# Patient Record
Sex: Male | Born: 1960 | Race: White | Hispanic: No | Marital: Married | Smoking: Never smoker
Health system: Southern US, Community
[De-identification: ages and names within clinical notes are randomized; demographics above are authoritative.]

## PROBLEM LIST (undated history)

## (undated) DIAGNOSIS — G4730 Sleep apnea, unspecified: Secondary | ICD-10-CM

## (undated) DIAGNOSIS — K219 Gastro-esophageal reflux disease without esophagitis: Secondary | ICD-10-CM

## (undated) DIAGNOSIS — E039 Hypothyroidism, unspecified: Secondary | ICD-10-CM

## (undated) DIAGNOSIS — J45909 Unspecified asthma, uncomplicated: Secondary | ICD-10-CM

## (undated) DIAGNOSIS — Z8679 Personal history of other diseases of the circulatory system: Secondary | ICD-10-CM

## (undated) DIAGNOSIS — G629 Polyneuropathy, unspecified: Secondary | ICD-10-CM

## (undated) DIAGNOSIS — G43909 Migraine, unspecified, not intractable, without status migrainosus: Secondary | ICD-10-CM

## (undated) DIAGNOSIS — Z8669 Personal history of other diseases of the nervous system and sense organs: Secondary | ICD-10-CM

## (undated) DIAGNOSIS — Z0000 Encounter for general adult medical examination without abnormal findings: Secondary | ICD-10-CM

## (undated) DIAGNOSIS — N486 Induration penis plastica: Secondary | ICD-10-CM

## (undated) DIAGNOSIS — K625 Hemorrhage of anus and rectum: Secondary | ICD-10-CM

## (undated) HISTORY — DX: Hypothyroidism, unspecified: E03.9

## (undated) HISTORY — DX: Polyneuropathy, unspecified: G62.9

## (undated) HISTORY — PX: THYROIDECTOMY: SHX17

## (undated) HISTORY — DX: Migraine, unspecified, not intractable, without status migrainosus: G43.909

## (undated) HISTORY — PX: INNER EAR SURGERY: SHX679

## (undated) HISTORY — PX: HERNIA REPAIR: SHX51

## (undated) HISTORY — PX: TONSILLECTOMY AND ADENOIDECTOMY: SHX28

## (undated) HISTORY — DX: Unspecified asthma, uncomplicated: J45.909

## (undated) HISTORY — DX: Encounter for general adult medical examination without abnormal findings: Z00.00

## (undated) HISTORY — DX: Induration penis plastica: N48.6

## (undated) HISTORY — PX: OTHER SURGICAL HISTORY: SHX169

## (undated) HISTORY — DX: Hemorrhage of anus and rectum: K62.5

## (undated) HISTORY — DX: Personal history of other diseases of the nervous system and sense organs: Z86.69

## (undated) HISTORY — PX: VASECTOMY: SHX75

---

## 2004-12-26 ENCOUNTER — Ambulatory Visit: Payer: Self-pay | Admitting: Orthopedic Surgery

## 2008-06-12 ENCOUNTER — Observation Stay: Payer: Self-pay | Admitting: Internal Medicine

## 2008-06-16 ENCOUNTER — Ambulatory Visit: Payer: Self-pay | Admitting: Family Medicine

## 2009-04-28 ENCOUNTER — Emergency Department: Payer: Self-pay | Admitting: Emergency Medicine

## 2009-09-18 ENCOUNTER — Ambulatory Visit: Payer: Self-pay | Admitting: Surgery

## 2009-09-21 ENCOUNTER — Ambulatory Visit: Payer: Self-pay | Admitting: Surgery

## 2011-06-17 IMAGING — CR DG CHEST 2V
1 series · 2 of 2 positions shown · non-contrast
Comparison: none

REASON FOR EXAM: chest pain..pt in rm 15
COMMENTS:   LMP: (Male)

[Series 1: view not recorded · 0.17mm/px · 2 of 2 slices shown]
[im 1/2]
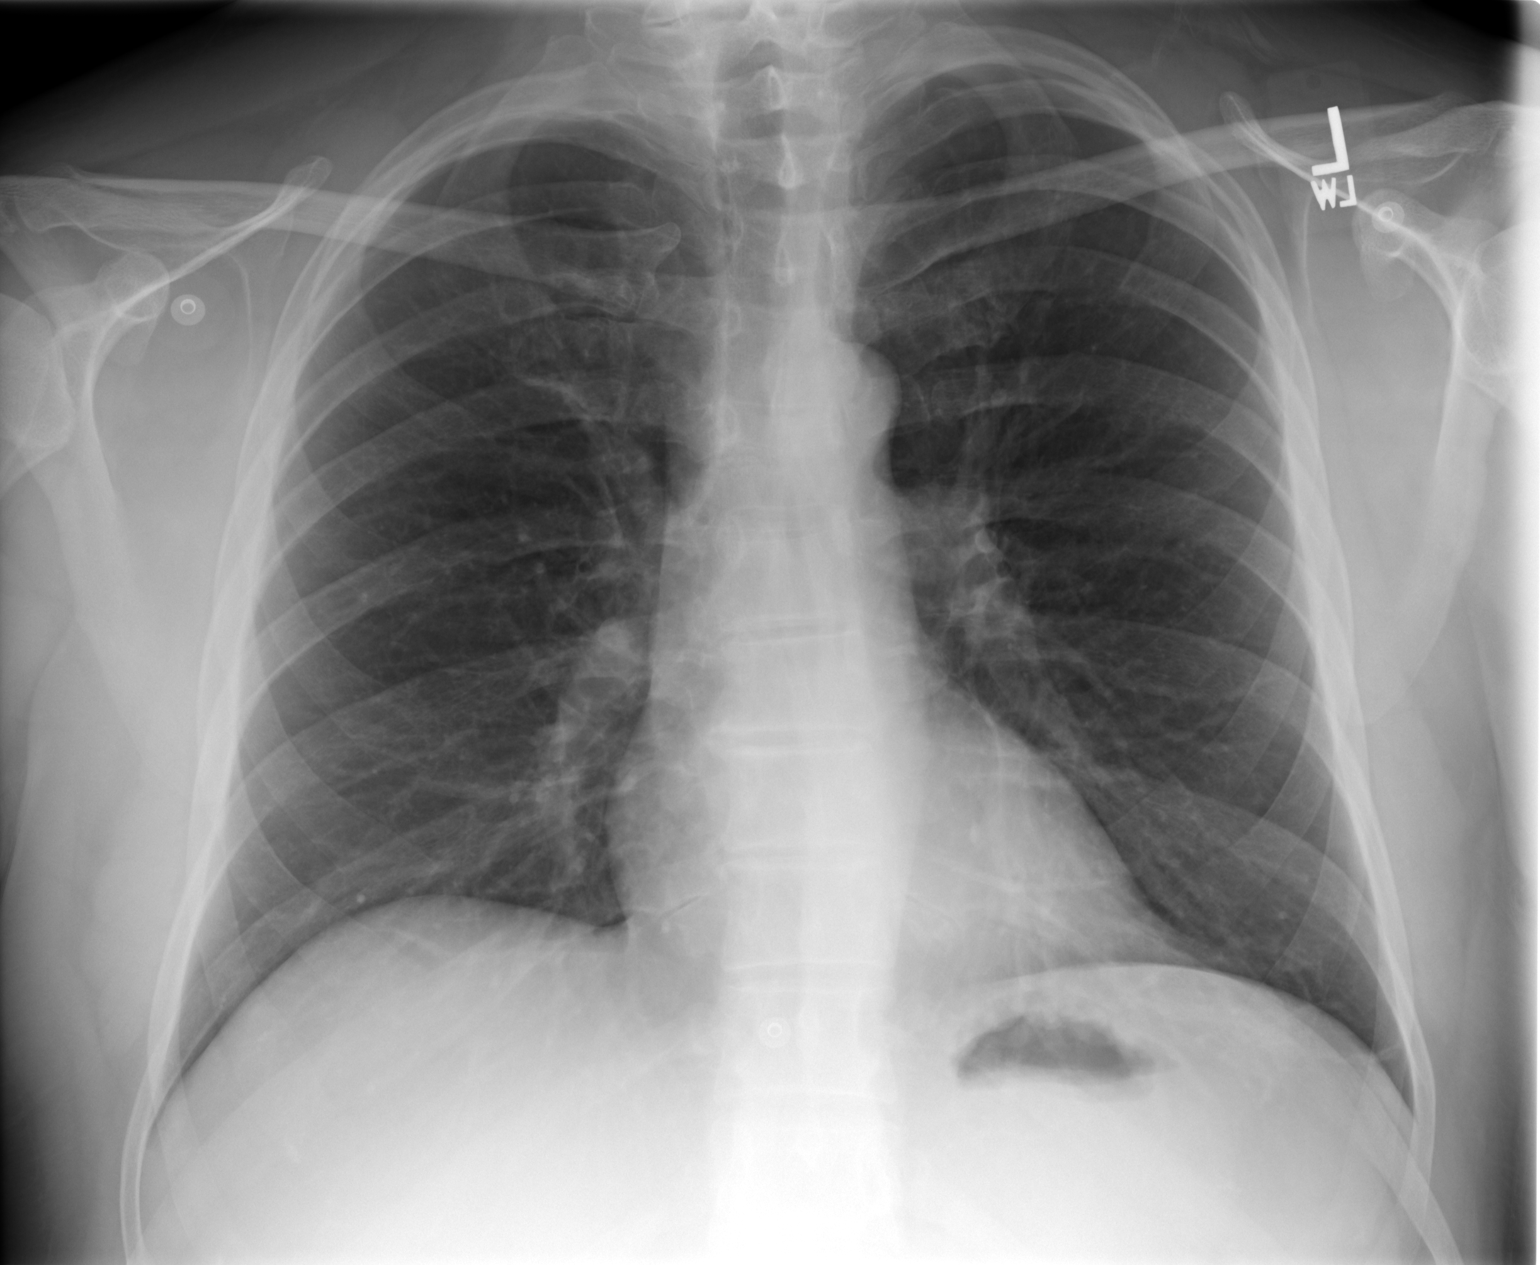
[im 2/2]
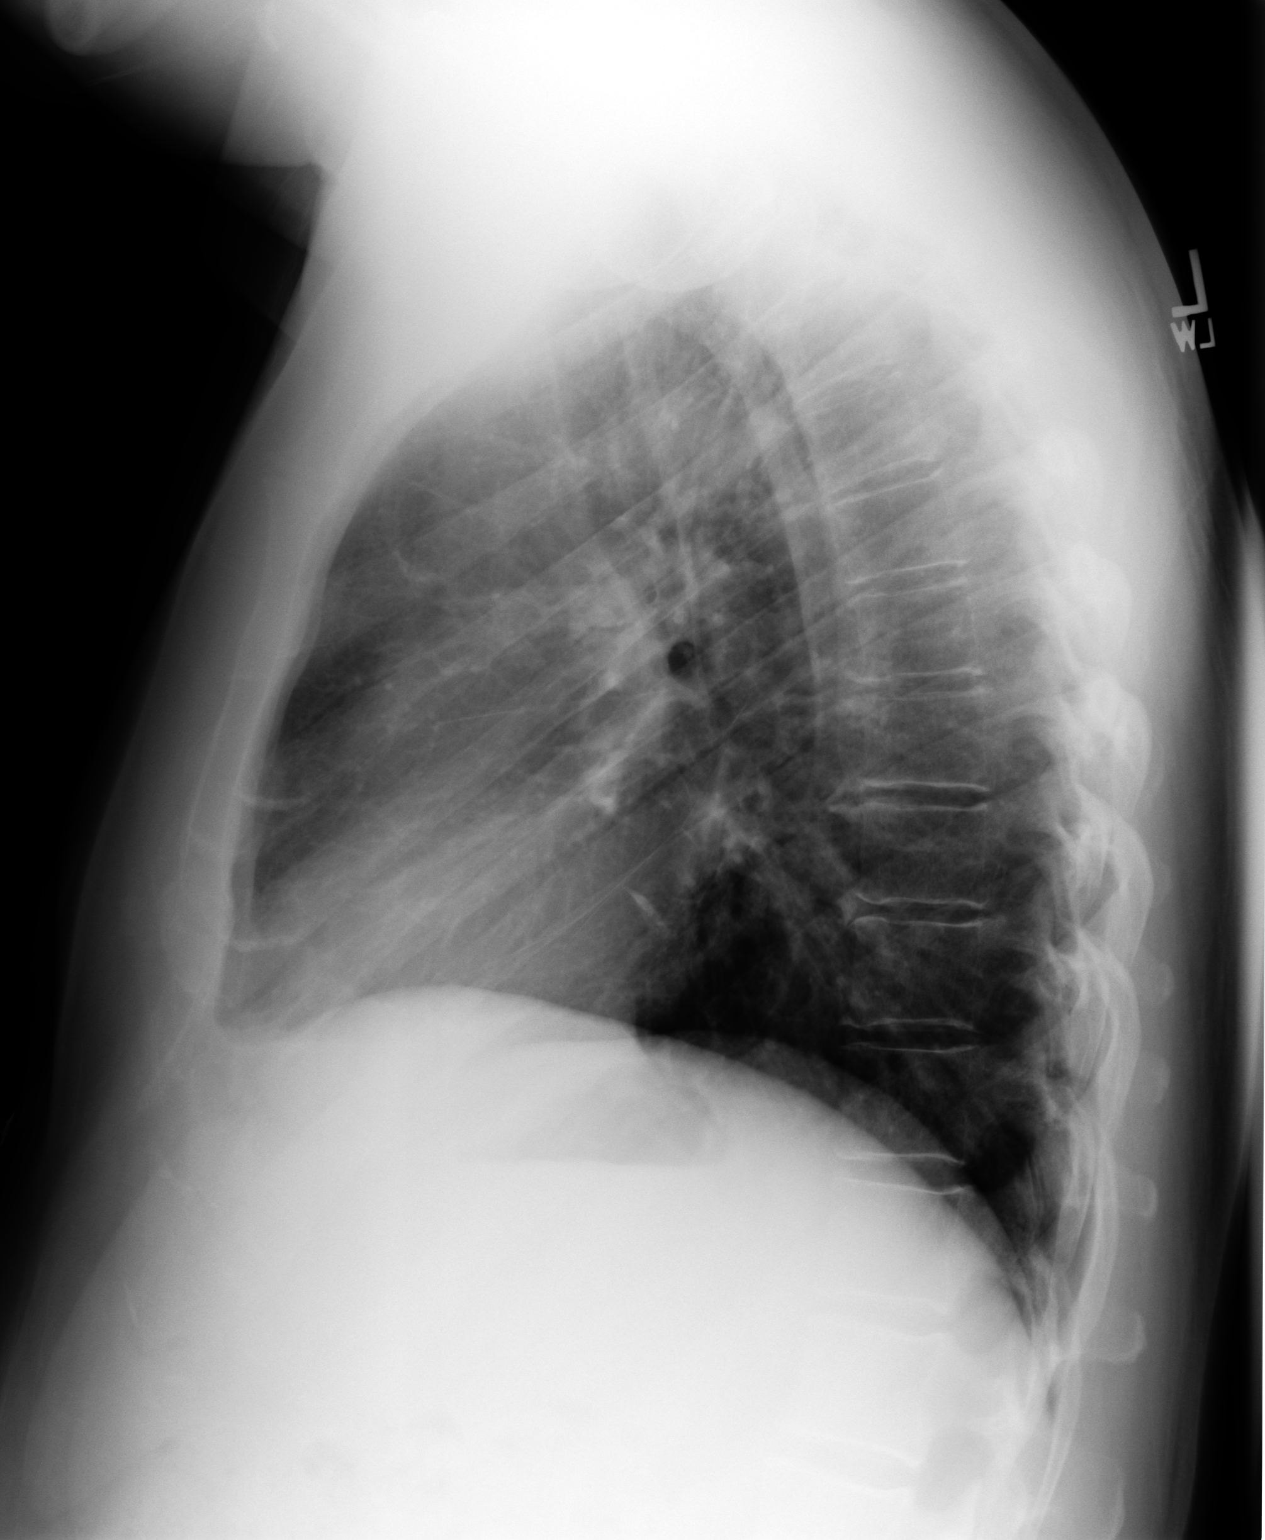

[2 of 2 positions shown; findings below may reference images not displayed]

PROCEDURE:     DXR - DXR CHEST PA (OR AP) AND LATERAL  - April 28, 2009  [DATE]

RESULT:     Comparison is made to the study 12 June, 2008.

The lungs are well-expanded. There is no focal infiltrate. The heart is
normal in size. The pulmonary vascularity is not engorged. There is mild
tortuosity of the descending thoracic aorta. The bony thorax is grossly
intact.
IMPRESSION: I do not see evidence of acute cardiopulmonary abnormality.

## 2012-01-09 ENCOUNTER — Emergency Department: Payer: Self-pay | Admitting: *Deleted

## 2012-01-09 LAB — BASIC METABOLIC PANEL
Anion Gap: 8 (ref 7–16)
BUN: 13 mg/dL (ref 7–18)
Creatinine: 0.93 mg/dL (ref 0.60–1.30)
EGFR (African American): >60
EGFR (Non-African Amer.): >60
Glucose: 139 mg/dL — ABNORMAL HIGH (ref 65–99)
Osmolality: 284 (ref 275–301)

## 2012-01-09 LAB — CK TOTAL AND CKMB (NOT AT ARMC)
CK, Total: 155 U/L (ref 35–232)
CK-MB: 2.6 ng/mL (ref 0.5–3.6)

## 2012-01-09 LAB — CBC
HCT: 40.4 % (ref 40.0–52.0)
HGB: 14.2 g/dL (ref 13.0–18.0)
MCH: 30.7 pg (ref 26.0–34.0)
MCHC: 35.1 g/dL (ref 32.0–36.0)
MCV: 88 fL (ref 80–100)

## 2012-07-30 ENCOUNTER — Ambulatory Visit: Payer: Self-pay | Admitting: Family Medicine

## 2012-07-30 LAB — RAPID INFLUENZA A&B ANTIGENS

## 2013-05-04 ENCOUNTER — Ambulatory Visit: Payer: Self-pay | Admitting: Family Medicine

## 2013-05-15 DIAGNOSIS — D1739 Benign lipomatous neoplasm of skin and subcutaneous tissue of other sites: Secondary | ICD-10-CM | POA: Insufficient documentation

## 2014-01-10 ENCOUNTER — Ambulatory Visit: Payer: Self-pay | Admitting: Family Medicine

## 2014-02-27 IMAGING — CT CT HEAD WITHOUT CONTRAST
2 series · 16 of 30 positions shown, 20 images · non-contrast
Comparison: none

REASON FOR EXAM: headache; states worst headache in his life
COMMENTS:

PROCEDURE:     CT  - CT HEAD WITHOUT CONTRAST  - January 09, 2012 [DATE]
RESULT:     Comparison:  06/12/2008
TECHNIQUE: Multiple axial images from the foramen magnum to the vertex were
obtained without IV contrast.

[Series 2: without · axial · non-contrast · 0.42mm/px · z∈[+1256,+1390]mm · 13 of 33 slices shown, 17 images]
[im 3/33  brain]
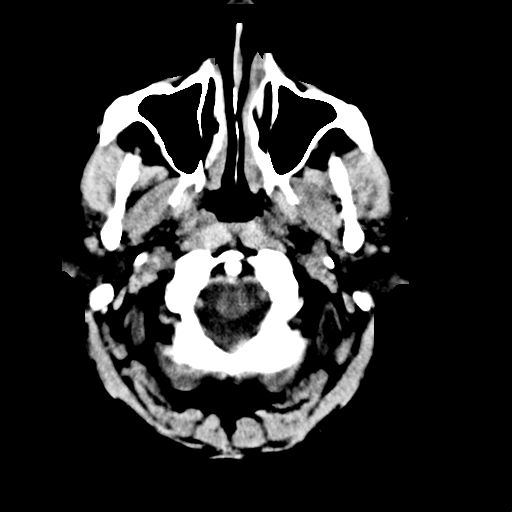
[im 3/33  bone]
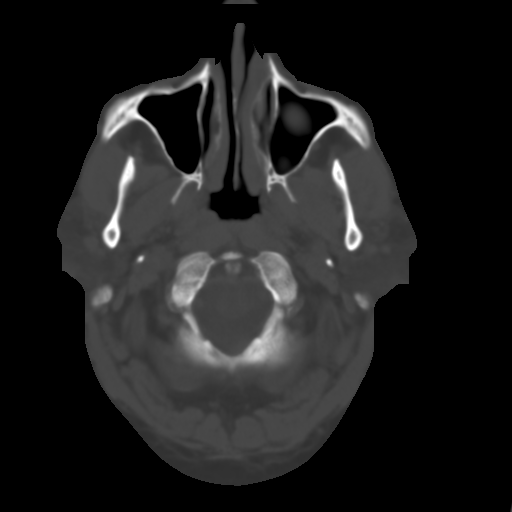
[im 5/33  brain]
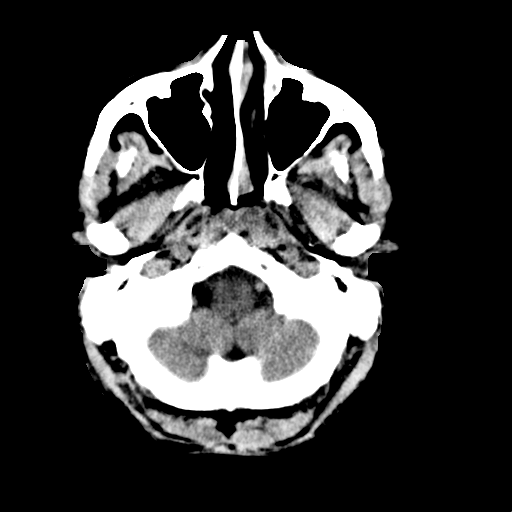
[im 7/33  brain]
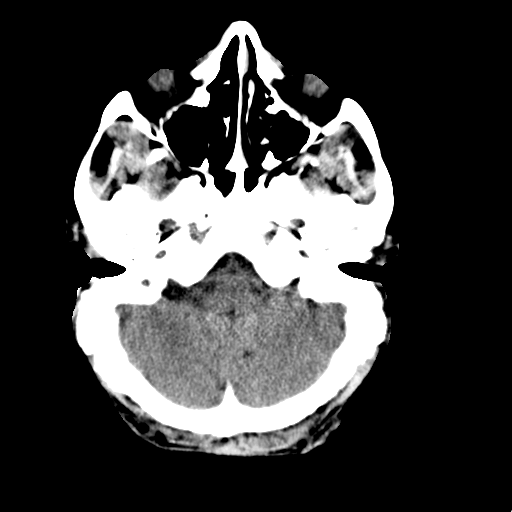
[im 10/33  brain]
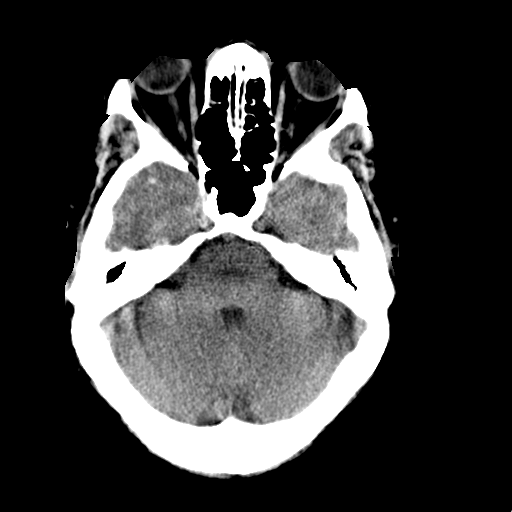
[im 12/33  brain]
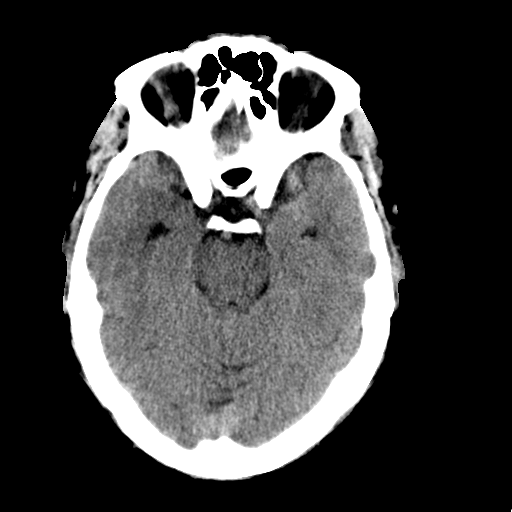
[im 12/33  bone]
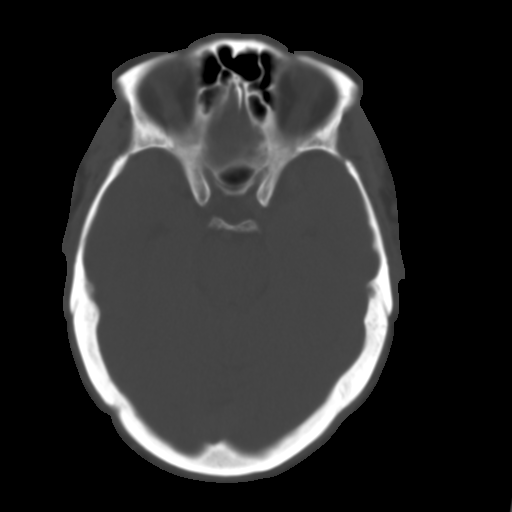
[im 14/33  brain]
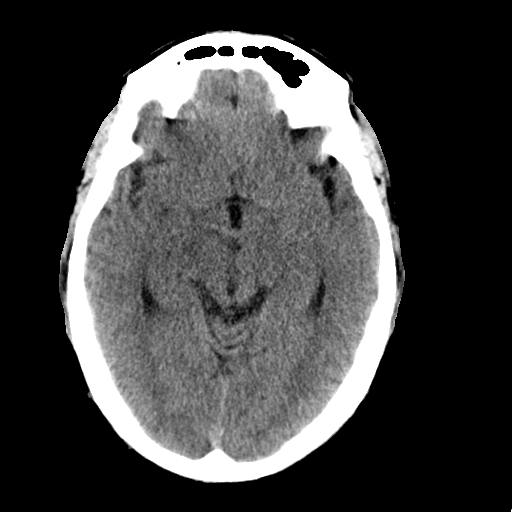
[im 17/33  brain]
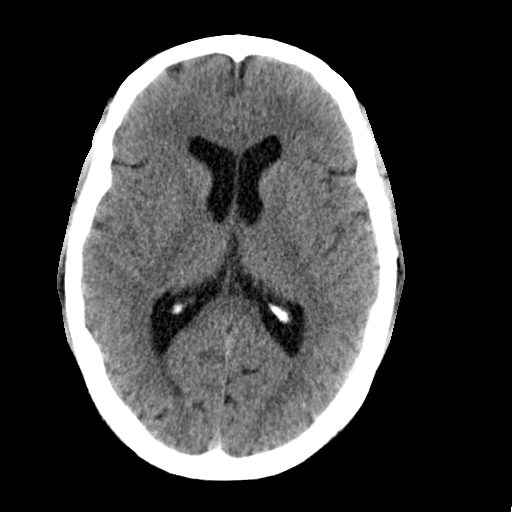
[im 19/33  brain]
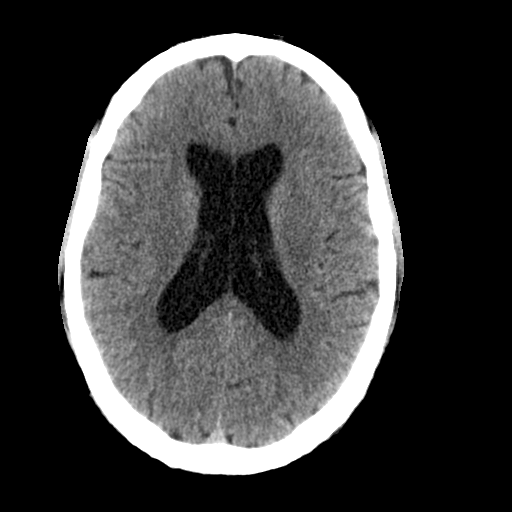
[im 21/33  brain]
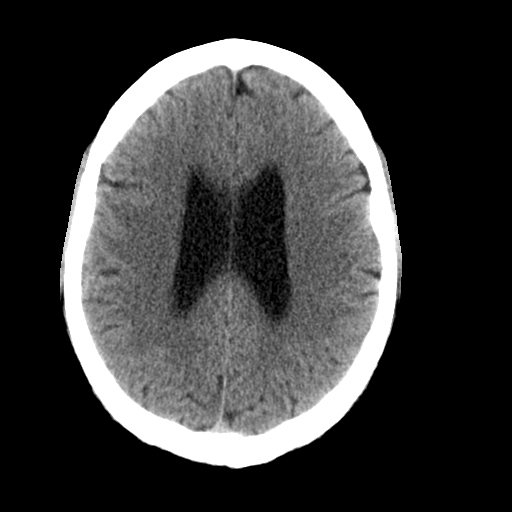
[im 21/33  bone]
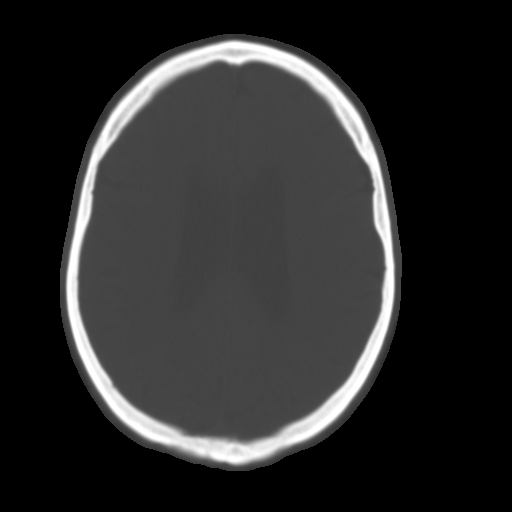
[im 23/33  brain]
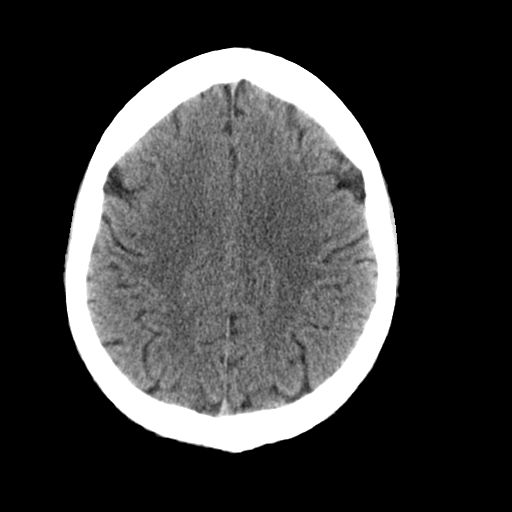
[im 26/33  brain]
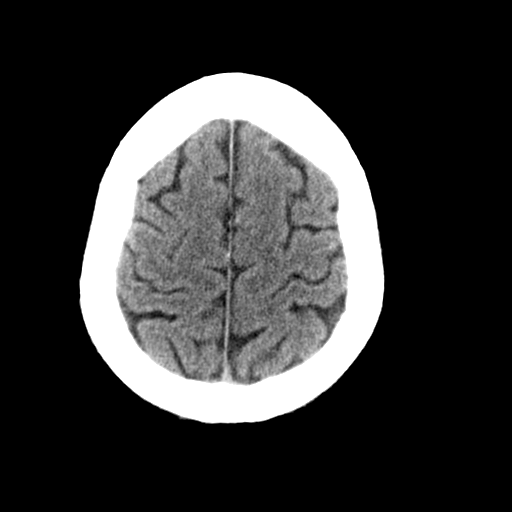
[im 28/33  brain]
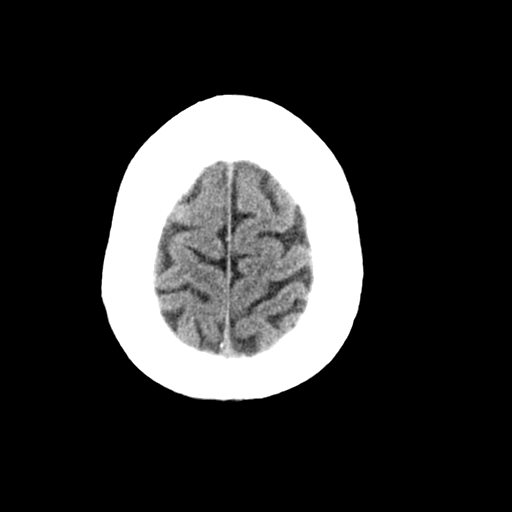
[im 30/33  brain]
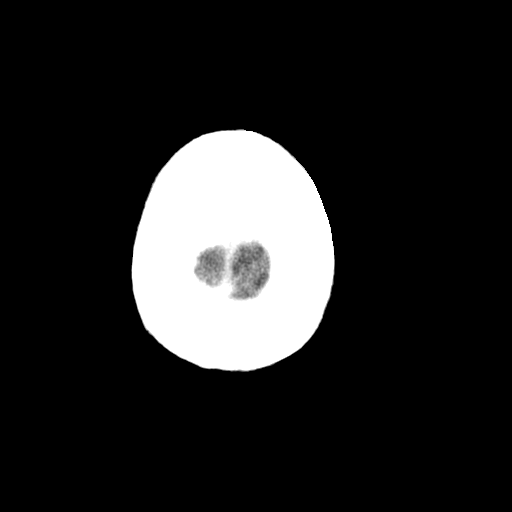
[im 30/33  bone]
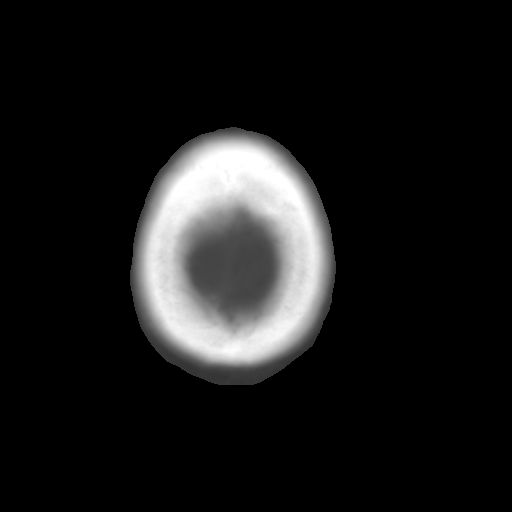

[Series 3: bone · axial · 0.42mm/px · z∈[+1256,+1300]mm · 3 of 33 slices shown]
[im 3/33  bone]
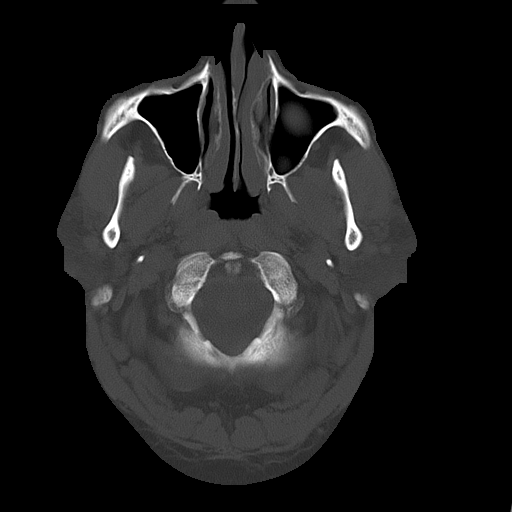
[im 7/33  bone]
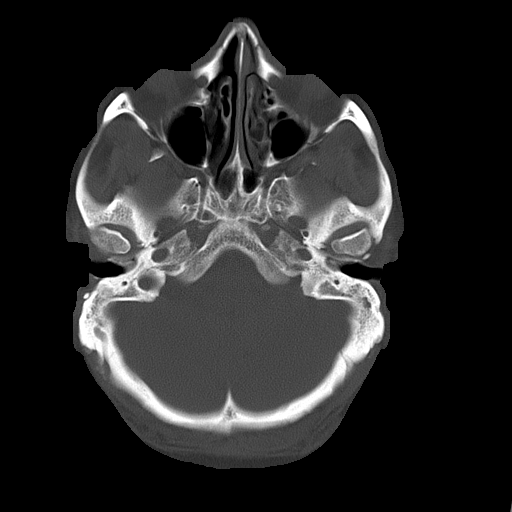
[im 12/33  bone]
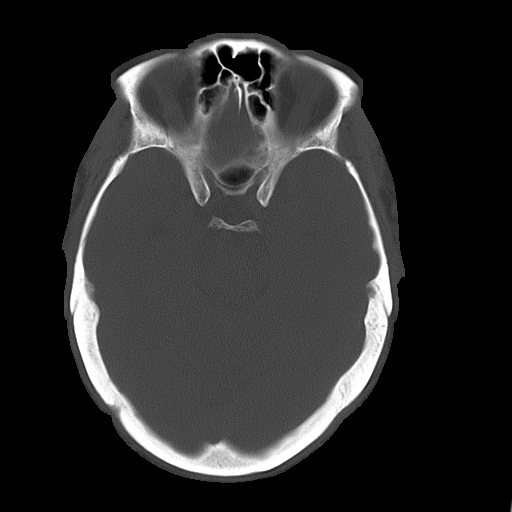

[16 of 30 positions shown; findings below may reference images not displayed]

FINDINGS: There is no evidence of mass effect, midline shift, or extra-axial fluid
collections.  There is no evidence of a space-occupying lesion or
intracranial hemorrhage. There is no evidence of a cortical-based area of
acute infarction.

The ventricles and sulci are appropriate for the patient's age. The basal
cisterns are patent.

Visualized portions of the orbits are unremarkable. Indication attention
cyst in the left maxillary sinus. Bilateral mastoid sinuses are hypoplastic
unchanged from 06/12/2008.

The osseous structures are unremarkable.
IMPRESSION: No acute intracranial process.

[REDACTED]

## 2015-04-04 DIAGNOSIS — M545 Low back pain, unspecified: Secondary | ICD-10-CM | POA: Insufficient documentation

## 2015-04-04 DIAGNOSIS — Z0000 Encounter for general adult medical examination without abnormal findings: Secondary | ICD-10-CM

## 2015-04-04 DIAGNOSIS — G43009 Migraine without aura, not intractable, without status migrainosus: Secondary | ICD-10-CM | POA: Insufficient documentation

## 2015-04-04 DIAGNOSIS — Z008 Encounter for other general examination: Secondary | ICD-10-CM | POA: Insufficient documentation

## 2015-04-04 DIAGNOSIS — E039 Hypothyroidism, unspecified: Secondary | ICD-10-CM | POA: Insufficient documentation

## 2015-04-04 DIAGNOSIS — J45909 Unspecified asthma, uncomplicated: Secondary | ICD-10-CM | POA: Insufficient documentation

## 2015-04-04 HISTORY — DX: Encounter for general adult medical examination without abnormal findings: Z00.00

## 2015-05-02 DIAGNOSIS — R21 Rash and other nonspecific skin eruption: Secondary | ICD-10-CM | POA: Insufficient documentation

## 2015-09-11 DIAGNOSIS — G629 Polyneuropathy, unspecified: Secondary | ICD-10-CM

## 2015-09-11 HISTORY — DX: Polyneuropathy, unspecified: G62.9

## 2016-02-06 DIAGNOSIS — R05 Cough: Secondary | ICD-10-CM | POA: Insufficient documentation

## 2016-02-06 DIAGNOSIS — R059 Cough, unspecified: Secondary | ICD-10-CM | POA: Insufficient documentation

## 2016-12-10 DIAGNOSIS — K625 Hemorrhage of anus and rectum: Secondary | ICD-10-CM | POA: Insufficient documentation

## 2016-12-10 DIAGNOSIS — N486 Induration penis plastica: Secondary | ICD-10-CM | POA: Insufficient documentation

## 2016-12-10 DIAGNOSIS — G43909 Migraine, unspecified, not intractable, without status migrainosus: Secondary | ICD-10-CM

## 2016-12-10 DIAGNOSIS — E039 Hypothyroidism, unspecified: Secondary | ICD-10-CM | POA: Insufficient documentation

## 2016-12-10 HISTORY — DX: Hypothyroidism, unspecified: E03.9

## 2016-12-10 HISTORY — DX: Migraine, unspecified, not intractable, without status migrainosus: G43.909

## 2016-12-12 ENCOUNTER — Ambulatory Visit: Payer: Self-pay | Admitting: Surgery

## 2016-12-15 ENCOUNTER — Encounter: Payer: Self-pay | Admitting: Surgery

## 2016-12-15 ENCOUNTER — Ambulatory Visit (INDEPENDENT_AMBULATORY_CARE_PROVIDER_SITE_OTHER): Payer: BLUE CROSS/BLUE SHIELD | Admitting: Surgery

## 2016-12-15 VITALS — BP 119/80 | HR 79 | Temp 97.7°F | Ht 70.0 in | Wt 224.4 lb

## 2016-12-15 DIAGNOSIS — K4091 Unilateral inguinal hernia, without obstruction or gangrene, recurrent: Secondary | ICD-10-CM | POA: Diagnosis not present

## 2016-12-15 NOTE — Progress Notes (Signed)
12/15/2016  Reason for Visit:  Left inguinal hernia  History of Present Illness: Jesus Dean is a 56 y.o. male status post open left inguinal hernia repair with mesh in 2011 by Dr. Felton Clinton. He reports that about 6 months ago he noticed recurrence of his left inguinal hernia. However hasn't been until 2-3 weeks ago that he started having worsening symptoms. He reports having increased pressure and discomfort over the left groin particularly when he is coughing or exerting pressure he reports increasing discomfort as well during bowel movements and while voiding. He does endorse constipation. This past week his symptoms have improved but he still having some discomfort issues. Initially with the worst discomfort, he reports that it was more painful to walk because of the extra pressure. He denies any alleviating factors. Reports the pain is worse towards the end of the day. He does do some lifting but is not strenuous at work. He denies any fevers or chills, chest pain, shortness of breath, nausea, vomiting, abdominal pain.  Of note, the patient has had bilateral hernia repairs. Reports that he's had at least 2 repairs on the left, one with mesh in 1 without mesh prior to the most recent one by Dr. Felton Clinton  Past Medical History: Past Medical History:  Diagnosis Date  . Asthma   . History of migraine headaches   . Hypothyroidism   . Hypothyroidism, unspecified 12/10/2016  . Migraine headache 12/10/2016  . Neuropathy 09/11/2015   Last Assessment & Plan:  Denies any new complaints.    . Peyronie disease   . Rectal bleeding   . Routine history and physical examination of adult 04/04/2015   Overview:  Prevnar 2016.  Last Assessment & Plan:  He is agreeable to colonoscopy.   Ordered colonoscopy. Check labs today.     Past Surgical History: Past Surgical History:  Procedure Laterality Date  . hernia Left   . HERNIA REPAIR Right   . TONSILLECTOMY AND ADENOIDECTOMY    . VASECTOMY      Home  Medications: Prior to Admission medications   Medication Sig Start Date End Date Taking? Authorizing Provider  albuterol (PROVENTIL HFA;VENTOLIN HFA) 108 (90 Base) MCG/ACT inhaler Inhale 2 puffs into the lungs every 6 (six) hours as needed. 07/18/16  Yes [provider]  atenolol (TENORMIN) 25 MG tablet Take 1 tablet by mouth daily. 08/14/16 08/14/17 Yes [provider]  levothyroxine (SYNTHROID, LEVOTHROID) 200 MCG tablet Take 1 tablet by mouth daily. 07/18/16  Yes [provider]  Multiple Vitamins-Minerals (MULTIVITAMIN ADULT PO) Take 1 tablet by mouth daily. 04/23/09  Yes [provider]    Allergies: Not on File  Social History:  reports that he has never smoked. He has never used smokeless tobacco. He reports that he does not drink alcohol or use drugs.   Family History: Family History  Problem Relation Age of Onset  . COPD Father   . Cancer Father        Colon  . Lung cancer Father   . Prostate cancer Father   . Hypothyroidism Father   . COPD Mother   . Hypertension Mother   . Hypothyroidism Mother   . Brain cancer Sister     Review of Systems: Review of Systems  Constitutional: Negative for chills and fever.  HENT: Negative for hearing loss.   Eyes: Negative for blurred vision.  Respiratory: Negative for cough.   Cardiovascular: Negative for chest pain.  Gastrointestinal: Positive for constipation. Negative for abdominal pain, nausea  and vomiting.  Genitourinary: Positive for dysuria.  Musculoskeletal: Negative for myalgias.  Skin: Negative for rash.  Neurological: Negative for dizziness.  Psychiatric/Behavioral: Negative for depression.    Physical Exam BP 119/80   Pulse 79   Temp 97.7 F (36.5 C) (Oral)   Ht 5\' 10"  (1.778 m)   Wt 101.8 kg (224 lb 6.4 oz)   BMI 32.20 kg/m  CONSTITUTIONAL: No acute distress HEENT:  Normocephalic, atraumatic, extraocular motion intact. NECK: Trachea is midline, and there is no jugular venous  distension.  RESPIRATORY:  Lungs are clear, and breath sounds are equal bilaterally. Normal respiratory effort without pathologic use of accessory muscles. CARDIOVASCULAR: Heart is regular without murmurs, gallops, or rubs. GI: The abdomen is soft, nondistended, nontender to palpation. Patient has a recurrent left inguinal hernia on exam. There is mild discomfort with palpation and reduction of the hernia but it is easily reducible. There is no hernia evidence on the right. There were no palpable masses.  MUSCULOSKELETAL:  Normal muscle strength and tone in all four extremities.  No peripheral edema or cyanosis. SKIN: Skin turgor is normal. There are no pathologic skin lesions.  NEUROLOGIC:  Motor and sensation is grossly normal.  Cranial nerves are grossly intact. PSYCH:  Alert and oriented to person, place and time. Affect is normal.  Laboratory Analysis: No results found for this or any previous visit (from the past 24 hour(s)).  Imaging: No results found.  Assessment and Plan: This is a 56 y.o. male who presents with a recurrent left inguinal hernia. Initially the patient was having more symptoms about 2-3 weeks ago but this past week he has been feeling better.  Discussed with the patient different management options ranging from conservative to surgical. Discussed with the patient that his constipation can definitely make his symptoms worse due to the increased pressure and fullness. Instructed him to start taking MiraLAX once daily or twice daily in order to have a soft daily bowel movement so he is not having to strain is hard. Also discussed with the patient the use of a truss or hernia underwear in order to counteract the pressure on the left side. The patient will try these options first. He is going on vacation in 2 weeks. After that we'll determine whether these conservative measures are enough to help with his symptoms or if indeed he would require redo hernia surgery. If he does,  discussed with him the potential for attempting this laparoscopically given that there is been no prior laparoscopic attempt in order to avoid all the scar tissue on the outside. Depending on the patient's symptoms, he'll present back to the office for further discussion.  Face-to-face time spent with the patient and care providers was 45 minutes, with more than 50% of the time spent counseling, educating, and coordinating care of the patient.     Melvyn Neth, [place]

## 2016-12-15 NOTE — Patient Instructions (Signed)
Please call our office when you decide to move forward with your surgery. We would like for you to try Miralax for your constipation. This can be purchased at [facility] or any drug store.  Please increase your water intake to 72 ounces daily.  You can purchase a Hernia Truss at [employer] store.          Inguinal Hernia, Adult An inguinal hernia is when fat or the intestines push through the area where the leg meets the lower belly (groin) and make a rounded lump (bulge). This condition happens over time. There are three types of inguinal hernias. These types include:  Hernias that can be pushed back into the belly (are reducible).  Hernias that cannot be pushed back into the belly (are incarcerated).  Hernias that cannot be pushed back into the belly and lose their blood supply (get strangulated). This type needs emergency surgery.  Follow these instructions at home: Lifestyle  Drink enough fluid to keep your urine (pee) clear or pale yellow.  Eat plenty of fruits, vegetables, and whole grains. These have a lot of fiber. Talk with your doctor if you have questions.  Avoid lifting heavy objects.  Avoid standing for long periods of time.  Do not use tobacco products. These include cigarettes, chewing tobacco, or e-cigarettes. If you need help quitting, ask your doctor.  Try to stay at a healthy weight. General instructions  Do not try to force the hernia back in.  Watch your hernia for any changes in color or size. Let your doctor know if there are any changes.  Take over-the-counter and prescription medicines only as told by your doctor.  Keep all follow-up visits as told by your doctor. This is important. Contact a doctor if:  You have a fever.  You have new symptoms.  Your symptoms get worse. Get help right away if:  The area where the legs meets the lower belly has: ? Pain that gets worse suddenly. ? A bulge that gets bigger suddenly and does not  go down. ? A bulge that turns red or purple. ? A bulge that is painful to the touch.  You are a man and your scrotum: ? Suddenly feels painful. ? Suddenly changes in size.  You feel sick to your stomach (nauseous) and this feeling does not go away.  You throw up (vomit) and this keeps happening.  You feel your heart beating a lot more quickly than normal.  You cannot poop (have a bowel movement) or pass gas. This information is not intended to replace advice given to you by your health care provider. Make sure you discuss any questions you have with your health care provider. Document Released: 07/24/2006 Document Revised: 11/29/2015 Document Reviewed: 05/03/2014 Elsevier Interactive Patient Education  2018 [employer].     Constipation, Adult Constipation is when a person:  Poops (has a bowel movement) fewer times in a week than normal.  Has a hard time pooping.  Has poop that is dry, hard, or bigger than normal.  Follow these instructions at home: Eating and drinking   Eat foods that have a lot of fiber, such as: ? Fresh fruits and vegetables. ? Whole grains. ? Beans.  Eat less of foods that are high in fat, low in fiber, or overly processed, such as: ? [place] fries. ? Hamburgers. ? Cookies. ? Candy. ? Soda.  Drink enough fluid to keep your pee (urine) clear or pale yellow. General instructions  Exercise regularly or as  told by your doctor.  Go to the restroom when you feel like you need to poop. Do not hold it in.  Take over-the-counter and prescription medicines only as told by your doctor. These include any fiber supplements.  Do pelvic floor retraining exercises, such as: ? Doing deep breathing while relaxing your lower belly (abdomen). ? Relaxing your pelvic floor while pooping.  Watch your condition for any changes.  Keep all follow-up visits as told by your doctor. This is important. Contact a doctor if:  You have pain that gets worse.  You  have a fever.  You have not pooped for 4 days.  You throw up (vomit).  You are not hungry.  You lose weight.  You are bleeding from the anus.  You have thin, pencil-like poop (stool). Get help right away if:  You have a fever, and your symptoms suddenly get worse.  You leak poop or have blood in your poop.  Your belly feels hard or bigger than normal (is bloated).  You have very bad belly pain.  You feel dizzy or you faint. This information is not intended to replace advice given to you by your health care provider. Make sure you discuss any questions you have with your health care provider. Document Released: 12/10/2007 Document Revised: 01/11/2016 Document Reviewed: 12/12/2015 Elsevier Interactive Patient Education  2017 [employer].

## 2017-03-23 DIAGNOSIS — R071 Chest pain on breathing: Secondary | ICD-10-CM | POA: Insufficient documentation

## 2017-05-27 ENCOUNTER — Ambulatory Visit (INDEPENDENT_AMBULATORY_CARE_PROVIDER_SITE_OTHER): Payer: BLUE CROSS/BLUE SHIELD | Admitting: Surgery

## 2017-05-27 ENCOUNTER — Encounter: Payer: Self-pay | Admitting: Surgery

## 2017-05-27 VITALS — BP 121/74 | HR 83 | Temp 97.4°F | Ht 70.0 in | Wt 230.0 lb

## 2017-05-27 DIAGNOSIS — K4091 Unilateral inguinal hernia, without obstruction or gangrene, recurrent: Secondary | ICD-10-CM

## 2017-05-27 NOTE — H&P (View-Only) (Signed)
05/27/2017  History of Present Illness: Jesus Dean is a 56 y.o. male who presents for follow up of a left inguinal hernia.  He was last seen in the office on 12/15/16 for initial evaluation and he decided to postpone surgery and try conservative measures.  He reports however, that he is having more symptoms from his hernia recently.  He had a recent episode of more significant pain that improved on its own.  He stay away from heavy lifting if possible, but still has to do some lifting of up to 30 lbs at work at times.  He has noticed that after standing up for 8+ hours at work each day, that he has more soreness at the end of the day.  Denies any nausea or vomiting.  Reports intermittent constipation and that this also makes the hernia more sore when he has to strain more for a bowel movement.  Otherwise, no obstructive type of symptoms.  Of note, he has had bilateral open hernia repair, and he thinks 2 open repairs on the left side.  No bulging or symptoms on the right.  Past Medical History: Past Medical History:  Diagnosis Date  . Asthma   . History of migraine headaches   . Hypothyroidism   . Hypothyroidism, unspecified 12/10/2016  . Migraine headache 12/10/2016  . Neuropathy 09/11/2015   Last Assessment & Plan:  Denies any new complaints.    . Peyronie disease   . Rectal bleeding   . Routine history and physical examination of adult 04/04/2015   Overview:  Prevnar 2016.  Last Assessment & Plan:  He is agreeable to colonoscopy.   Ordered colonoscopy. Check labs today.     Past Surgical History: Past Surgical History:  Procedure Laterality Date  . hernia Left   . HERNIA REPAIR Right   . TONSILLECTOMY AND ADENOIDECTOMY    . VASECTOMY      Home Medications: Prior to Admission medications   Medication Sig Start Date End Date Taking? Authorizing Provider  albuterol (PROVENTIL HFA;VENTOLIN HFA) 108 (90 Base) MCG/ACT inhaler Inhale 2 puffs into the lungs every 6 (six) hours as needed.  07/18/16  Yes [provider]  atenolol (TENORMIN) 25 MG tablet Take 1 tablet by mouth daily. 08/14/16 08/14/17 Yes [provider]  levothyroxine (SYNTHROID, LEVOTHROID) 200 MCG tablet Take 1 tablet by mouth daily. 07/18/16  Yes [provider]  Multiple Vitamins-Minerals (MULTIVITAMIN ADULT PO) Take 1 tablet by mouth daily. 04/23/09  Yes [provider]    Allergies: No Known Allergies  Social History:  reports that  has never smoked. he has never used smokeless tobacco. He reports that he does not drink alcohol or use drugs.   Family History: Family History  Problem Relation Age of Onset  . COPD Father   . Cancer Father        Colon  . Lung cancer Father   . Prostate cancer Father   . Hypothyroidism Father   . COPD Mother   . Hypertension Mother   . Hypothyroidism Mother   . Brain cancer Sister     Review of Systems: Review of Systems  Constitutional: Negative for chills and fever.  HENT: Negative for hearing loss.   Respiratory: Negative for shortness of breath.   Cardiovascular: Negative for chest pain.  Gastrointestinal: Positive for abdominal pain (at left groin) and constipation. Negative for diarrhea, nausea and vomiting.  Genitourinary: Negative for dysuria.  Musculoskeletal: Negative for myalgias.  Skin: Negative for rash.  Neurological:  Negative for dizziness.  Psychiatric/Behavioral: Negative for depression.    Physical Exam BP 121/74   Pulse 83   Temp (!) 97.4 F (36.3 C) (Oral)   Ht 5\' 10"  (1.778 m)   Wt 104.3 kg (230 lb)   BMI 33.00 kg/m  CONSTITUTIONAL: No acute distress HEENT:  Normocephalic, atraumatic, extraocular motion intact. NECK: Trachea is midline, and there is no jugular venous distension. RESPIRATORY:  Lungs are clear, and breath sounds are equal bilaterally. Normal respiratory effort without pathologic use of accessory muscles. CARDIOVASCULAR: Heart is regular without murmurs, gallops, or rubs. GI: The  abdomen is soft, non-distended, non-tender to palpation.  He has a reducible left inguinal hernia.  Mild discomfort on palpation of hernia.  No hernia on the right.  No evidence for strangulation, skin discoloration.  MUSCULOSKELETAL:  Normal muscle strength and tone in all four extremities.  No peripheral edema or cyanosis. SKIN: Skin turgor is normal. There are no pathologic skin lesions.  NEUROLOGIC:  Motor and sensation is grossly normal.  Cranial nerves are grossly intact. PSYCH:  Alert and oriented to person, place and time. Affect is normal.  Labs/Imaging: None recently  Assessment and Plan: This is a 56 y.o. male who presents with a reducible though symptomatic left inguinal hernia.  Discussed with the patient that if he's having more and/or worse symptoms, it is reasonable to proceed with surgical repair.  Since he has had two open repairs, discussed with patient that we can start approaching this laparoscopically, with possibility of having to convert to open.  Also, if once inside, we can see that he has a right inguinal hernia as well, we can fix it at that point too.  If there is too much scarring inside given the chronicity of his hernia, we may have to convert to open.  Discussed the risk of bleeding, infection, and injury to surrounding structures with both types of procedures and he is willing to proceed.  He will be scheduled for 12/18 for laparoscopic left inguinal hernia.  Face-to-face time spent with the patient and care providers was 40 minutes, with more than 50% of the time spent counseling, educating, and coordinating care of the patient.     Melvyn Neth, [place]

## 2017-05-27 NOTE — Patient Instructions (Signed)
You have chose to have your hernia repaired. This will be done by Dr. Olean Ree on 06/23/2017 at [facility].  Please see your (blue) Pre-care information that you have been given today.  You will need to arrange to be out of work for 2 weeks and then return with a lifting restrictions for 4 more weeks. Please send any FMLA paperwork prior to surgery and we will fill this out and fax it back to your employer within 3 business days.  You may have a bruise in your groin and also swelling and brusing in your testicle area. You may use ice 4-5 times daily for 15-20 minutes each time. Make sure that you place a barrier between you and the ice pack. To decrease the swelling, you may roll up a bath towel and place it vertically in between your thighs with your testicles resting on the towel. You will want to keep this area elevated as much as possible for several days following surgery.    Inguinal Hernia, Adult Muscles help keep everything in the body in its proper place. But if a weak spot in the muscles develops, something can poke through. That is called a hernia. When this happens in the lower part of the belly (abdomen), it is called an inguinal hernia. (It takes its name from a part of the body in this region called the inguinal canal.) A weak spot in the wall of muscles lets some fat or part of the small intestine bulge through. An inguinal hernia can develop at any age. Men get them more often than women. CAUSES  In adults, an inguinal hernia develops over time.  It can be triggered by:  Suddenly straining the muscles of the lower abdomen.  Lifting heavy objects.  Straining to have a bowel movement. Difficult bowel movements (constipation) can lead to this.  Constant coughing. This may be caused by smoking or lung disease.  Being overweight.  Being pregnant.  Working at a job that requires long periods of standing or heavy lifting.  Having had an inguinal hernia before. One type can be  an emergency situation. It is called a strangulated inguinal hernia. It develops if part of the small intestine slips through the weak spot and cannot get back into the abdomen. The blood supply can be cut off. If that happens, part of the intestine may die. This situation requires emergency surgery. SYMPTOMS  Often, a small inguinal hernia has no symptoms. It is found when a healthcare provider does a physical exam. Larger hernias usually have symptoms.   In adults, symptoms may include:  A lump in the groin. This is easier to see when the person is standing. It might disappear when lying down.  In men, a lump in the scrotum.  Pain or burning in the groin. This occurs especially when lifting, straining or coughing.  A dull ache or feeling of pressure in the groin.  Signs of a strangulated hernia can include:  A bulge in the groin that becomes very painful and tender to the touch.  A bulge that turns red or purple.  Fever, nausea and vomiting.  Inability to have a bowel movement or to pass gas. DIAGNOSIS  To decide if you have an inguinal hernia, a healthcare provider will probably do a physical examination.  This will include asking questions about any symptoms you have noticed.  The healthcare provider might feel the groin area and ask you to cough. If an inguinal hernia is felt, the healthcare  provider may try to slide it back into the abdomen.  Usually no other tests are needed. TREATMENT  Treatments can vary. The size of the hernia makes a difference. Options include:  Watchful waiting. This is often suggested if the hernia is small and you have had no symptoms.  No medical procedure will be done unless symptoms develop.  You will need to watch closely for symptoms. If any occur, contact your healthcare provider right away.  Surgery. This is used if the hernia is larger or you have symptoms.  Open surgery. This is usually an outpatient procedure (you will not stay  overnight in a hospital). An cut (incision) is made through the skin in the groin. The hernia is put back inside the abdomen. The weak area in the muscles is then repaired by herniorrhaphy or hernioplasty. Herniorrhaphy: in this type of surgery, the weak muscles are sewn back together. Hernioplasty: a patch or mesh is used to close the weak area in the abdominal wall.  Laparoscopy. In this procedure, a surgeon makes small incisions. A thin tube with a tiny video camera (called a laparoscope) is put into the abdomen. The surgeon repairs the hernia with mesh by looking with the video camera and using two long instruments. HOME CARE INSTRUCTIONS   After surgery to repair an inguinal hernia:  You will need to take pain medicine prescribed by your healthcare provider. Follow all directions carefully.  You will need to take care of the wound from the incision.  Your activity will be restricted for awhile. This will probably include no heavy lifting for several weeks. You also should not do anything too active for a few weeks. When you can return to work will depend on the type of job that you have.  During "watchful waiting" periods, you should:  Maintain a healthy weight.  Eat a diet high in fiber (fruits, vegetables and whole grains).  Drink plenty of fluids to avoid constipation. This means drinking enough water and other liquids to keep your urine clear or pale yellow.  Do not lift heavy objects.  Do not stand for long periods of time.  Quit smoking. This should keep you from developing a frequent cough. SEEK MEDICAL CARE IF:   A bulge develops in your groin area.  You feel pain, a burning sensation or pressure in the groin. This might be worse if you are lifting or straining.  You develop a fever of more than 100.5 F (38.1 C). SEEK IMMEDIATE MEDICAL CARE IF:   Pain in the groin increases suddenly.  A bulge in the groin gets bigger suddenly and does not go down.  For men, there  is sudden pain in the scrotum. Or, the size of the scrotum increases.  A bulge in the groin area becomes red or purple and is painful to touch.  You have nausea or vomiting that does not go away.  You feel your heart beating much faster than normal.  You cannot have a bowel movement or pass gas.  You develop a fever of more than 102.0 F (38.9 C).   This information is not intended to replace advice given to you by your health care provider. Make sure you discuss any questions you have with your health care provider.   Document Released: 11/09/2008 Document Revised: 09/15/2011 Document Reviewed: 12/25/2014 Elsevier Interactive Patient Education [employer].

## 2017-05-27 NOTE — Progress Notes (Signed)
05/27/2017  History of Present Illness: Jesus Dean is a 56 y.o. male who presents for follow up of a left inguinal hernia.  He was last seen in the office on 12/15/16 for initial evaluation and he decided to postpone surgery and try conservative measures.  He reports however, that he is having more symptoms from his hernia recently.  He had a recent episode of more significant pain that improved on its own.  He stay away from heavy lifting if possible, but still has to do some lifting of up to 30 lbs at work at times.  He has noticed that after standing up for 8+ hours at work each day, that he has more soreness at the end of the day.  Denies any nausea or vomiting.  Reports intermittent constipation and that this also makes the hernia more sore when he has to strain more for a bowel movement.  Otherwise, no obstructive type of symptoms.  Of note, he has had bilateral open hernia repair, and he thinks 2 open repairs on the left side.  No bulging or symptoms on the right.  Past Medical History: Past Medical History:  Diagnosis Date  . Asthma   . History of migraine headaches   . Hypothyroidism   . Hypothyroidism, unspecified 12/10/2016  . Migraine headache 12/10/2016  . Neuropathy 09/11/2015   Last Assessment & Plan:  Denies any new complaints.    . Peyronie disease   . Rectal bleeding   . Routine history and physical examination of adult 04/04/2015   Overview:  Prevnar 2016.  Last Assessment & Plan:  He is agreeable to colonoscopy.   Ordered colonoscopy. Check labs today.     Past Surgical History: Past Surgical History:  Procedure Laterality Date  . hernia Left   . HERNIA REPAIR Right   . TONSILLECTOMY AND ADENOIDECTOMY    . VASECTOMY      Home Medications: Prior to Admission medications   Medication Sig Start Date End Date Taking? Authorizing Provider  albuterol (PROVENTIL HFA;VENTOLIN HFA) 108 (90 Base) MCG/ACT inhaler Inhale 2 puffs into the lungs every 6 (six) hours as needed.  07/18/16  Yes [provider]  atenolol (TENORMIN) 25 MG tablet Take 1 tablet by mouth daily. 08/14/16 08/14/17 Yes [provider]  levothyroxine (SYNTHROID, LEVOTHROID) 200 MCG tablet Take 1 tablet by mouth daily. 07/18/16  Yes [provider]  Multiple Vitamins-Minerals (MULTIVITAMIN ADULT PO) Take 1 tablet by mouth daily. 04/23/09  Yes [provider]    Allergies: No Known Allergies  Social History:  reports that  has never smoked. he has never used smokeless tobacco. He reports that he does not drink alcohol or use drugs.   Family History: Family History  Problem Relation Age of Onset  . COPD Father   . Cancer Father        Colon  . Lung cancer Father   . Prostate cancer Father   . Hypothyroidism Father   . COPD Mother   . Hypertension Mother   . Hypothyroidism Mother   . Brain cancer Sister     Review of Systems: Review of Systems  Constitutional: Negative for chills and fever.  HENT: Negative for hearing loss.   Respiratory: Negative for shortness of breath.   Cardiovascular: Negative for chest pain.  Gastrointestinal: Positive for abdominal pain (at left groin) and constipation. Negative for diarrhea, nausea and vomiting.  Genitourinary: Negative for dysuria.  Musculoskeletal: Negative for myalgias.  Skin: Negative for rash.  Neurological:  Negative for dizziness.  Psychiatric/Behavioral: Negative for depression.    Physical Exam BP 121/74   Pulse 83   Temp (!) 97.4 F (36.3 C) (Oral)   Ht 5\' 10"  (1.778 m)   Wt 104.3 kg (230 lb)   BMI 33.00 kg/m  CONSTITUTIONAL: No acute distress HEENT:  Normocephalic, atraumatic, extraocular motion intact. NECK: Trachea is midline, and there is no jugular venous distension. RESPIRATORY:  Lungs are clear, and breath sounds are equal bilaterally. Normal respiratory effort without pathologic use of accessory muscles. CARDIOVASCULAR: Heart is regular without murmurs, gallops, or rubs. GI: The  abdomen is soft, non-distended, non-tender to palpation.  He has a reducible left inguinal hernia.  Mild discomfort on palpation of hernia.  No hernia on the right.  No evidence for strangulation, skin discoloration.  MUSCULOSKELETAL:  Normal muscle strength and tone in all four extremities.  No peripheral edema or cyanosis. SKIN: Skin turgor is normal. There are no pathologic skin lesions.  NEUROLOGIC:  Motor and sensation is grossly normal.  Cranial nerves are grossly intact. PSYCH:  Alert and oriented to person, place and time. Affect is normal.  Labs/Imaging: None recently  Assessment and Plan: This is a 56 y.o. male who presents with a reducible though symptomatic left inguinal hernia.  Discussed with the patient that if he's having more and/or worse symptoms, it is reasonable to proceed with surgical repair.  Since he has had two open repairs, discussed with patient that we can start approaching this laparoscopically, with possibility of having to convert to open.  Also, if once inside, we can see that he has a right inguinal hernia as well, we can fix it at that point too.  If there is too much scarring inside given the chronicity of his hernia, we may have to convert to open.  Discussed the risk of bleeding, infection, and injury to surrounding structures with both types of procedures and he is willing to proceed.  He will be scheduled for 12/18 for laparoscopic left inguinal hernia.  Face-to-face time spent with the patient and care providers was 40 minutes, with more than 50% of the time spent counseling, educating, and coordinating care of the patient.     Melvyn Neth, [place]

## 2017-06-01 ENCOUNTER — Telehealth: Payer: Self-pay | Admitting: Surgery

## 2017-06-01 NOTE — Telephone Encounter (Signed)
I have called patient to go over surgery information. Patient was unable to talk at this time due to being in a meeting at work. He will call back.   Please advise patient of pre op date/time and sx date. Sx: 06/23/17 with Dr Piscoya--laparoscopic left inguinal hernia repair.  Pre op: 06/15/17 between 9-1:00pm--phone interview.   Please make patient aware to call [phone], between 1-3:00pm the day before surgery, to find out what time to arrive.

## 2017-06-02 NOTE — Telephone Encounter (Signed)
Patient is aware of all surgery information.

## 2017-06-02 NOTE — Telephone Encounter (Signed)
Patient called back I relayed this information.

## 2017-06-04 ENCOUNTER — Telehealth: Payer: Self-pay | Admitting: Surgery

## 2017-06-04 NOTE — Telephone Encounter (Signed)
Patient is calling he needs to know what time his surgery will be due to the patient has to take his grand kids to school please call patient and advice.

## 2017-06-05 NOTE — Telephone Encounter (Signed)
I have called patient back and patient would like for his surgery to start after 8:00am so that he can get his kids and grand kids to school and daycare. I have advised the patient that we will arrange his surgery time so that he will be able to arrive after 8:00am for his surgery. Patient was reminded to call the day before surgery at [phone] the day before surgery to confirm his arrival time.

## 2017-06-15 ENCOUNTER — Encounter
Admission: RE | Admit: 2017-06-15 | Discharge: 2017-06-15 | Disposition: A | Discharge status: Home / Self Care | Payer: Self-pay | Source: Ambulatory Visit | Attending: Surgery | Admitting: Surgery

## 2017-06-15 ENCOUNTER — Other Ambulatory Visit: Payer: Self-pay

## 2017-06-15 HISTORY — DX: Sleep apnea, unspecified: G47.30

## 2017-06-15 HISTORY — DX: Gastro-esophageal reflux disease without esophagitis: K21.9

## 2017-06-15 NOTE — Patient Instructions (Signed)
  Your procedure is scheduled on: 06-23-17 Report to Same Day Surgery 2nd floor medical mall [facility] Entrance-take elevator on left to 2nd floor.  Check in with surgery information desk.) To find out your arrival time please call [phone] between 1PM - 3PM on 06-22-17  Remember: Instructions that are not followed completely may result in serious medical risk, up to and including death, or upon the discretion of your surgeon and anesthesiologist your surgery may need to be rescheduled.    _x___ 1. Do not eat food after midnight the night before your procedure. NO GUM CHEWING OR CANDY AFTER MIDNIGHT.  You may drink clear liquids up to 2 hours before you are scheduled to arrive at the hospital for your procedure.  Do not drink clear liquids within 2 hours of your scheduled arrival to the hospital.  Clear liquids include  --Water or Apple juice without pulp  --Clear carbohydrate beverage such as ClearFast or Gatorade  --Black Coffee or Clear Tea (No milk, no creamers, do not add anything to the coffee or Tea     __x__ 2. No Alcohol for 24 hours before or after surgery.   __x__3. No Smoking for 24 prior to surgery.   ____  4. Bring all medications with you on the day of surgery if instructed.    __x__ 5. Notify your doctor if there is any change in your medical condition     (cold, fever, infections).     Do not wear jewelry, make-up, hairpins, clips or nail polish.  Do not wear lotions, powders, or perfumes. You may wear deodorant.  Do not shave 48 hours prior to surgery. Men may shave face and neck.  Do not bring valuables to the hospital.    [facility] is not responsible for any belongings or valuables.               Contacts, dentures or bridgework may not be worn into surgery.  Leave your suitcase in the car. After surgery it may be brought to your room.  For patients admitted to the hospital, discharge time is determined by your treatment team.   Patients discharged the  day of surgery will not be allowed to drive home.  You will need someone to drive you home and stay with you the night of your procedure.     _x___ TAKE THE FOLLOWING MEDICATION THE MORNING OF SURGERY WITH A SMALL SIP OF WATER. These include:  1. FAMOTIDINE   2. TAKE A FAMOTIDINE MONDAY NIGHT BEFORE BED  3.  4.  5.  6.  ____Fleets enema or Magnesium Citrate as directed.   ____ Use CHG Soap or sage wipes as directed on instruction sheet   ____ Use inhalers on the day of surgery and bring to hospital day of surgery  ____ Stop Metformin and Janumet 2 days prior to surgery.    ____ Take 1/2 of usual insulin dose the night before surgery and none on the morning surgery.   ____ Follow recommendations from Cardiologist, Pulmonologist or PCP regarding stopping Aspirin, Coumadin, Plavix ,Eliquis, Effient, or Pradaxa, and Pletal.  X____Stop Anti-inflammatories such as Advil, Aleve, IBUPROFEN, Motrin, Naproxen, Naprosyn, Goodies powders or aspirin products NOW-OK to take Tylenol    ____ Stop supplements until after surgery.     ____ Bring C-Pap to the hospital.

## 2017-06-17 ENCOUNTER — Telehealth: Payer: Self-pay | Admitting: General Practice

## 2017-06-17 NOTE — Telephone Encounter (Signed)
Patient has fmla/disability paperwork he needs filled out, payment has been paid and placed in folder up  [place]. Please make a copy for patients records.

## 2017-06-20 ENCOUNTER — Ambulatory Visit
Admission: EM | Admit: 2017-06-20 | Discharge: 2017-06-20 | Disposition: A | Discharge status: Home / Self Care | Payer: BLUE CROSS/BLUE SHIELD | Attending: Family Medicine | Admitting: Family Medicine

## 2017-06-20 ENCOUNTER — Encounter: Payer: Self-pay | Admitting: Gynecology

## 2017-06-20 ENCOUNTER — Other Ambulatory Visit: Payer: Self-pay

## 2017-06-20 DIAGNOSIS — M26622 Arthralgia of left temporomandibular joint: Secondary | ICD-10-CM | POA: Diagnosis not present

## 2017-06-20 DIAGNOSIS — H6692 Otitis media, unspecified, left ear: Secondary | ICD-10-CM

## 2017-06-20 MED ORDER — AMOXICILLIN 875 MG PO TABS: 875.0000 mg | ORAL_TABLET | Freq: Two times a day (BID) | ORAL | 0 refills | Status: DC

## 2017-06-20 NOTE — Discharge Instructions (Signed)
Take medication as prescribed. Rest. Drink plenty of fluids.  ° °Follow up with your primary care physician this week as needed. Return to Urgent care for new or worsening concerns.  ° °

## 2017-06-20 NOTE — ED Provider Notes (Signed)
[facility] ____________________________________________  Time seen: Approximately 1:05 PM  I have reviewed the triage vital signs and the nursing notes.   HISTORY  Chief Complaint Otalgia   HPI Jesus Dean is a 56 y.o. male presenting for evaluation of left ear pain that is been present for approximately 1 week.  States has had some nasal congestion and occasional cough but states that the congestion cough is almost fully resolved.  Denies trauma, drainage, decreased hearing, foreign body.  States that he does normally wear left hearing aid as he does have chronic decreased hearing.  Denies acute changes in his hearing.  States that he has also been under a lot of stress lately due to work and Christmas.  States that he does feel like he conscious down on his teeth, denies known grinding teeth at night.  Again denies fall or trauma.  Denies headache.  States the last few days has been eating softer foods, as it hurts to open and close his mouth along his left jaw.  Has not been taking any NSAIDs over-the-counter as he is scheduled for hernia repair surgery next week, denies other over-the-counter medications.  Denies other aggravating or alleviating factors.  Reports otherwise feels well.  Denies fevers.  Reports continues to drink fluids and eat well.  Denies chest pain, shortness of breath, abdominal pain, or rash. Denies recent sickness. Denies recent antibiotic use.   Baldo Ash, MD: PCP   Past Medical History:  Diagnosis Date  . Asthma   . GERD (gastroesophageal reflux disease)    OCC  . History of migraine headaches   . Hypothyroidism   . Hypothyroidism, unspecified 12/10/2016  . Migraine headache 12/10/2016  . Neuropathy 09/11/2015   Last Assessment & Plan:  Denies any new complaints.    . Peyronie disease   . Rectal bleeding   . Routine history and physical examination of adult 04/04/2015   Overview:  Prevnar 2016.  Last Assessment & Plan:  He is agreeable to  colonoscopy.   Ordered colonoscopy. Check labs today.  . Sleep apnea    DOES NOT HAVE CPAP-HAD THE 1ST PART OF THE TEST DONE AND DID NOT GO BACK FOR THE 2ND PART    Patient Active Problem List   Diagnosis Date Noted  . Chest pain on breathing 03/23/2017  . Unilateral recurrent inguinal hernia without obstruction or gangrene 12/15/2016  . Hypothyroidism, unspecified 12/10/2016  . Rectal bleeding 12/10/2016  . Peyronie disease 12/10/2016  . Cough 02/06/2016  . Neuropathy 09/11/2015  . Rash 05/02/2015  . Asthma 04/04/2015  . Hypothyroidism 04/04/2015  . Midline low back pain without sciatica 04/04/2015  . Routine history and physical examination of adult 04/04/2015  . Lipoma of other skin and subcutaneous tissue 05/15/2013    Past Surgical History:  Procedure Laterality Date  . hernia Left   . HERNIA REPAIR Right   . INNER EAR SURGERY     MULTIPLE SURGERIES  . THYROIDECTOMY    . TONSILLECTOMY AND ADENOIDECTOMY    . VASECTOMY       No current facility-administered medications for this encounter.   Current Outpatient Medications:  .  albuterol (PROVENTIL HFA;VENTOLIN HFA) 108 (90 Base) MCG/ACT inhaler, Inhale 2 puffs into the lungs every 6 (six) hours as needed for wheezing or shortness of breath. , Disp: , Rfl:  .  atenolol (TENORMIN) 25 MG tablet, Take 25 mg by mouth at bedtime. , Disp: , Rfl:  .  famotidine (PEPCID AC) 10 MG  chewable tablet, Chew 10 mg by mouth as needed for heartburn., Disp: , Rfl:  .  ibuprofen (ADVIL,MOTRIN) 200 MG tablet, Take 400 mg by mouth every 6 (six) hours as needed for headache or moderate pain., Disp: , Rfl:  .  amoxicillin (AMOXIL) 875 MG tablet, Take 1 tablet (875 mg total) by mouth 2 (two) times daily., Disp: 20 tablet, Rfl: 0 .  levothyroxine (SYNTHROID, LEVOTHROID) 200 MCG tablet, Take 200 mcg by mouth at bedtime. , Disp: , Rfl:   Allergies Patient has no known allergies.  Family History  Problem Relation Age of Onset  . COPD Father     . Cancer Father        Colon  . Lung cancer Father   . Prostate cancer Father   . Hypothyroidism Father   . COPD Mother   . Hypertension Mother   . Hypothyroidism Mother   . Brain cancer Sister     Social History Social History   Tobacco Use  . Smoking status: Never Smoker  . Smokeless tobacco: Never Used  Substance Use Topics  . Alcohol use: No  . Drug use: No    Review of Systems Constitutional: No fever/chills ENT: No sore throat.as above.  Cardiovascular: Denies chest pain. Respiratory: Denies shortness of breath. Gastrointestinal: No abdominal pain.   Musculoskeletal: Negative for back pain. Skin: Negative for rash.  ____________________________________________   PHYSICAL EXAM:  VITAL SIGNS: ED Triage Vitals  Enc Vitals Group     BP 06/20/17 1252 121/81     Pulse Rate 06/20/17 1252 74     Resp 06/20/17 1252 18     Temp 06/20/17 1252 97.9 F (36.6 C)     Temp Source 06/20/17 1252 Oral     SpO2 06/20/17 1252 99 %     Weight 06/20/17 1251 228 lb (103.4 kg)     Height 06/20/17 1251 6' (1.829 m)     Head Circumference --      Peak Flow --      Pain Score 06/20/17 1253 8     Pain Loc --      Pain Edu? --      Excl. in [place]? --     Constitutional: Alert and oriented. Well appearing and in no acute distress. Eyes: Conjunctivae are normal. PERRL.  ENT      Head: Normocephalic and atraumatic.  No right TMJ tenderness palpation.  Mild to moderate left TMJ tenderness to palpation.  Full range of motion to TMJ joint remains.     Ears: Right: Nontender, no erythema, normal TM.  Left: Mild tenderness with auricle movement, normal canal, moderate erythema, dull TM and mild effusion noted.  No mastoid tenderness bilaterally.  No surrounding erythema or swelling bilaterally.      Nose: No congestion/rhinnorhea.      Mouth/Throat: Mucous membranes are moist.Oropharynx non-erythematous.  No tonsillar swelling or exudate. Neck: No stridor. Supple without meningismus.   Neck nontender. Hematological/Lymphatic/Immunilogical: No cervical lymphadenopathy. Cardiovascular: Normal rate, regular rhythm. Grossly normal heart sounds.  Good peripheral circulation. Respiratory: Normal respiratory effort without tachypnea nor retractions. Breath sounds are clear and equal bilaterally. No wheezes, rales, rhonchi. Musculoskeletal:  Steady gait.  Neurologic:  Normal speech and language. Speech is normal. No gait instability.  No facial paresthesias. Skin:  Skin is warm, dry and intact. No rash noted. Psychiatric: Mood and affect are normal. Speech and behavior are normal. Patient exhibits appropriate insight and judgment   ___________________________________________   LABS (all labs ordered are  listed, but only abnormal results are displayed)  Labs Reviewed - No data to display ____________________________________________  RADIOLOGY  No results found. ____________________________________________   PROCEDURES Procedures    INITIAL IMPRESSION / ASSESSMENT AND PLAN / ED COURSE  Pertinent labs & imaging results that were available during my care of the patient were reviewed by me and considered in my medical decision making (see chart for details).  Well-appearing patient.  No acute distress.  Reevaluation for left ear pain.  Patient does have left otitis with effusion as well as left TMJ tenderness.  Will treat patient with oral amoxicillin.  Encourage supportive care.  Discussed NSAID use, however as patient reports he has surgery coming this coming week he does not want take NSAIDs and will wait till after surgery.  Encouraged heat versus ice and supportive care, soft foods and avoidance of aggravating factors.  Discussed follow-up and return parameters.Discussed indication, risks and benefits of medications with patient.   Discussed follow up with Primary care physician this week. Discussed follow up and return parameters including no resolution or any worsening  concerns. Patient verbalized understanding and agreed to plan.   ____________________________________________   FINAL CLINICAL IMPRESSION(S) / ED DIAGNOSES  Final diagnoses:  Left otitis media, unspecified otitis media type  Arthralgia of left temporomandibular joint     ED Discharge Orders        Ordered    amoxicillin (AMOXIL) 875 MG tablet  2 times daily     06/20/17 1305       Note: This dictation was prepared with Dragon dictation along with smaller phrase technology. Any transcriptional errors that result from this process are unintentional.         Marylene Land, NP 06/20/17 1545

## 2017-06-20 NOTE — ED Triage Notes (Signed)
Patient c/o left ear pain x 1 week .

## 2017-06-22 MED ORDER — CEFAZOLIN SODIUM-DEXTROSE 2-4 GM/100ML-% IV SOLN
2.0000 g | INTRAVENOUS | Status: AC
  Administered 2017-06-23: 2 g via INTRAVENOUS

## 2017-06-23 ENCOUNTER — Ambulatory Visit: Payer: BLUE CROSS/BLUE SHIELD | Admitting: Anesthesiology

## 2017-06-23 ENCOUNTER — Encounter
Admission: RE | Disposition: A | Discharge status: Home / Self Care | Payer: Self-pay | Source: Ambulatory Visit | Attending: Surgery

## 2017-06-23 ENCOUNTER — Other Ambulatory Visit: Payer: Self-pay

## 2017-06-23 ENCOUNTER — Encounter: Payer: Self-pay | Admitting: Anesthesiology

## 2017-06-23 ENCOUNTER — Observation Stay
Admission: RE | Admit: 2017-06-23 | Discharge: 2017-06-24 | Disposition: A | Discharge status: Home / Self Care | Payer: BLUE CROSS/BLUE SHIELD | Source: Ambulatory Visit | Attending: Surgery | Admitting: Surgery

## 2017-06-23 DIAGNOSIS — K4091 Unilateral inguinal hernia, without obstruction or gangrene, recurrent: Principal | ICD-10-CM | POA: Insufficient documentation

## 2017-06-23 DIAGNOSIS — Z79899 Other long term (current) drug therapy: Secondary | ICD-10-CM | POA: Diagnosis not present

## 2017-06-23 DIAGNOSIS — K5900 Constipation, unspecified: Secondary | ICD-10-CM | POA: Insufficient documentation

## 2017-06-23 DIAGNOSIS — G629 Polyneuropathy, unspecified: Secondary | ICD-10-CM | POA: Insufficient documentation

## 2017-06-23 DIAGNOSIS — J45909 Unspecified asthma, uncomplicated: Secondary | ICD-10-CM | POA: Diagnosis not present

## 2017-06-23 DIAGNOSIS — E039 Hypothyroidism, unspecified: Secondary | ICD-10-CM | POA: Diagnosis not present

## 2017-06-23 HISTORY — PX: INGUINAL HERNIA REPAIR: SHX194

## 2017-06-23 SURGERY — REPAIR, HERNIA, INGUINAL, LAPAROSCOPIC
Anesthesia: General | Laterality: Left | Wound class: Clean

## 2017-06-23 MED ORDER — ACETAMINOPHEN 500 MG PO TABS
1000.0000 mg | ORAL_TABLET | ORAL | Status: AC
Start: 2017-06-23 — End: 2017-06-23
  Administered 2017-06-23: 1000 mg via ORAL

## 2017-06-23 MED ORDER — CHLORHEXIDINE GLUCONATE CLOTH 2 % EX PADS: 6.0000 | MEDICATED_PAD | Freq: Once | CUTANEOUS | Status: DC

## 2017-06-23 MED ORDER — ACETAMINOPHEN 500 MG PO TABS
ORAL_TABLET | ORAL | Status: AC
  Administered 2017-06-23: 1000 mg via ORAL
  Filled 2017-06-23: qty 2

## 2017-06-23 MED ORDER — MIDAZOLAM HCL 2 MG/2ML IJ SOLN
INTRAMUSCULAR | Status: AC
  Filled 2017-06-23: qty 2

## 2017-06-23 MED ORDER — SUGAMMADEX SODIUM 200 MG/2ML IV SOLN
INTRAVENOUS | Status: DC | PRN
  Administered 2017-06-23: 200 mg via INTRAVENOUS

## 2017-06-23 MED ORDER — ONDANSETRON 4 MG PO TBDP: 4.0000 mg | ORAL_TABLET | Freq: Four times a day (QID) | ORAL | Status: DC | PRN

## 2017-06-23 MED ORDER — LACTATED RINGERS IV SOLN
INTRAVENOUS | Status: DC | PRN
  Administered 2017-06-23: 11:00:00 via INTRAVENOUS

## 2017-06-23 MED ORDER — OXYCODONE HCL 5 MG PO TABS
ORAL_TABLET | ORAL | Status: AC
  Administered 2017-06-23: 10 mg via ORAL
  Filled 2017-06-23: qty 2

## 2017-06-23 MED ORDER — BUPIVACAINE-EPINEPHRINE 0.25% -1:200000 IJ SOLN
INTRAMUSCULAR | Status: DC | PRN
  Administered 2017-06-23: 30 mL

## 2017-06-23 MED ORDER — HYDROMORPHONE HCL 1 MG/ML IJ SOLN
INTRAMUSCULAR | Status: AC
  Filled 2017-06-23: qty 1

## 2017-06-23 MED ORDER — ONDANSETRON HCL 4 MG/2ML IJ SOLN
INTRAMUSCULAR | Status: AC
  Filled 2017-06-23: qty 2

## 2017-06-23 MED ORDER — ONDANSETRON HCL 4 MG/2ML IJ SOLN: 4.0000 mg | Freq: Four times a day (QID) | INTRAMUSCULAR | Status: DC | PRN

## 2017-06-23 MED ORDER — CEFAZOLIN SODIUM-DEXTROSE 2-4 GM/100ML-% IV SOLN
INTRAVENOUS | Status: AC
  Filled 2017-06-23: qty 100

## 2017-06-23 MED ORDER — FENTANYL CITRATE (PF) 100 MCG/2ML IJ SOLN
INTRAMUSCULAR | Status: DC | PRN
  Administered 2017-06-23: 50 ug via INTRAVENOUS

## 2017-06-23 MED ORDER — SUGAMMADEX SODIUM 200 MG/2ML IV SOLN
INTRAVENOUS | Status: AC
  Filled 2017-06-23: qty 2

## 2017-06-23 MED ORDER — FENTANYL CITRATE (PF) 100 MCG/2ML IJ SOLN
INTRAMUSCULAR | Status: AC
  Filled 2017-06-23: qty 2

## 2017-06-23 MED ORDER — FENTANYL CITRATE (PF) 100 MCG/2ML IJ SOLN
INTRAMUSCULAR | Status: AC
  Administered 2017-06-23: 25 ug via INTRAVENOUS
  Filled 2017-06-23: qty 2

## 2017-06-23 MED ORDER — BUPIVACAINE-EPINEPHRINE (PF) 0.25% -1:200000 IJ SOLN
INTRAMUSCULAR | Status: AC
  Filled 2017-06-23: qty 30

## 2017-06-23 MED ORDER — ALBUTEROL SULFATE HFA 108 (90 BASE) MCG/ACT IN AERS: 2.0000 | INHALATION_SPRAY | Freq: Four times a day (QID) | RESPIRATORY_TRACT | Status: DC | PRN

## 2017-06-23 MED ORDER — ATROPINE SULFATE 0.4 MG/ML IJ SOLN
INTRAMUSCULAR | Status: AC
  Filled 2017-06-23: qty 1

## 2017-06-23 MED ORDER — PHENYLEPHRINE HCL 10 MG/ML IJ SOLN
INTRAMUSCULAR | Status: DC | PRN
  Administered 2017-06-23: 100 ug via INTRAVENOUS

## 2017-06-23 MED ORDER — ONDANSETRON HCL 4 MG/2ML IJ SOLN
INTRAMUSCULAR | Status: DC | PRN
  Administered 2017-06-23: 4 mg via INTRAVENOUS

## 2017-06-23 MED ORDER — LIDOCAINE HCL (CARDIAC) 20 MG/ML IV SOLN
INTRAVENOUS | Status: DC | PRN
  Administered 2017-06-23: 100 mg via INTRAVENOUS

## 2017-06-23 MED ORDER — HYDROMORPHONE HCL 1 MG/ML IJ SOLN
0.5000 mg | INTRAMUSCULAR | Status: DC | PRN
  Administered 2017-06-23 – 2017-06-24 (×3): 0.5 mg via INTRAVENOUS
  Filled 2017-06-23 (×3): qty 0.5

## 2017-06-23 MED ORDER — HYDROMORPHONE HCL 1 MG/ML IJ SOLN
INTRAMUSCULAR | Status: DC | PRN
  Administered 2017-06-23: .2 mg via INTRAVENOUS

## 2017-06-23 MED ORDER — NEOSTIGMINE METHYLSULFATE 10 MG/10ML IV SOLN: INTRAVENOUS | Status: DC | PRN

## 2017-06-23 MED ORDER — POLYETHYLENE GLYCOL 3350 17 G PO PACK: 17.0000 g | PACK | Freq: Every day | ORAL | Status: DC | PRN

## 2017-06-23 MED ORDER — PROPOFOL 10 MG/ML IV BOLUS
INTRAVENOUS | Status: AC
  Filled 2017-06-23: qty 20

## 2017-06-23 MED ORDER — GABAPENTIN 300 MG PO CAPS
ORAL_CAPSULE | ORAL | Status: AC
  Administered 2017-06-23: 300 mg via ORAL
  Filled 2017-06-23: qty 1

## 2017-06-23 MED ORDER — ATENOLOL 25 MG PO TABS
25.0000 mg | ORAL_TABLET | Freq: Every evening | ORAL | Status: DC | PRN
  Filled 2017-06-23: qty 1

## 2017-06-23 MED ORDER — LACTATED RINGERS IV SOLN
INTRAVENOUS | Status: DC
  Administered 2017-06-23 – 2017-06-24 (×2): via INTRAVENOUS

## 2017-06-23 MED ORDER — FAMOTIDINE 20 MG PO TABS
20.0000 mg | ORAL_TABLET | Freq: Two times a day (BID) | ORAL | Status: DC | PRN
  Filled 2017-06-23: qty 1

## 2017-06-23 MED ORDER — EPHEDRINE SULFATE 50 MG/ML IJ SOLN
INTRAMUSCULAR | Status: DC | PRN
  Administered 2017-06-23: 15 mg via INTRAVENOUS

## 2017-06-23 MED ORDER — FENTANYL CITRATE (PF) 100 MCG/2ML IJ SOLN
25.0000 ug | INTRAMUSCULAR | Status: DC | PRN
  Administered 2017-06-23 (×2): 25 ug via INTRAVENOUS

## 2017-06-23 MED ORDER — MIDAZOLAM HCL 2 MG/2ML IJ SOLN
INTRAMUSCULAR | Status: DC | PRN
  Administered 2017-06-23: 2 mg via INTRAVENOUS

## 2017-06-23 MED ORDER — CHLORHEXIDINE GLUCONATE CLOTH 2 % EX PADS
6.0000 | MEDICATED_PAD | Freq: Once | CUTANEOUS | Status: AC
  Administered 2017-06-23: 6 via TOPICAL

## 2017-06-23 MED ORDER — PROPOFOL 10 MG/ML IV BOLUS
INTRAVENOUS | Status: DC | PRN
  Administered 2017-06-23: 150 mg via INTRAVENOUS

## 2017-06-23 MED ORDER — GLYCOPYRROLATE 0.2 MG/ML IJ SOLN
INTRAMUSCULAR | Status: DC | PRN
  Administered 2017-06-23: 0.2 mg via INTRAVENOUS

## 2017-06-23 MED ORDER — ROCURONIUM BROMIDE 100 MG/10ML IV SOLN
INTRAVENOUS | Status: DC | PRN
Start: 2017-06-23 — End: 2017-06-23
  Administered 2017-06-23: 20 mg via INTRAVENOUS
  Administered 2017-06-23: 25 mg via INTRAVENOUS
  Administered 2017-06-23: 30 mg via INTRAVENOUS

## 2017-06-23 MED ORDER — ACETAMINOPHEN 500 MG PO TABS
1000.0000 mg | ORAL_TABLET | Freq: Once | ORAL | Status: AC
  Administered 2017-06-23: 1000 mg via ORAL
  Filled 2017-06-23: qty 2

## 2017-06-23 MED ORDER — OXYCODONE HCL 5 MG PO TABS: 5.0000 mg | ORAL_TABLET | Freq: Four times a day (QID) | ORAL | 0 refills | Status: DC | PRN

## 2017-06-23 MED ORDER — LEVOTHYROXINE SODIUM 100 MCG PO TABS
200.0000 ug | ORAL_TABLET | Freq: Every day | ORAL | Status: DC
  Administered 2017-06-23: 200 ug via ORAL
  Filled 2017-06-23: qty 2

## 2017-06-23 MED ORDER — LACTATED RINGERS IV SOLN
INTRAVENOUS | Status: DC
  Administered 2017-06-23: 10:00:00 via INTRAVENOUS

## 2017-06-23 MED ORDER — OXYCODONE HCL 5 MG PO TABS
10.0000 mg | ORAL_TABLET | Freq: Once | ORAL | Status: AC
  Administered 2017-06-23: 10 mg via ORAL

## 2017-06-23 MED ORDER — EPHEDRINE SULFATE 50 MG/ML IJ SOLN
INTRAMUSCULAR | Status: AC
  Filled 2017-06-23: qty 1

## 2017-06-23 MED ORDER — OXYCODONE HCL 5 MG PO TABS
5.0000 mg | ORAL_TABLET | ORAL | Status: DC | PRN
  Administered 2017-06-23 – 2017-06-24 (×5): 10 mg via ORAL
  Filled 2017-06-23 (×5): qty 2

## 2017-06-23 MED ORDER — ACETAMINOPHEN 500 MG PO TABS
1000.0000 mg | ORAL_TABLET | Freq: Four times a day (QID) | ORAL | Status: DC
  Administered 2017-06-24 (×2): 1000 mg via ORAL
  Filled 2017-06-23 (×2): qty 2

## 2017-06-23 MED ORDER — ATROPINE SULFATE 0.4 MG/ML IJ SOLN
INTRAMUSCULAR | Status: DC | PRN
  Administered 2017-06-23: 0.4 mg via INTRAVENOUS

## 2017-06-23 MED ORDER — SUCCINYLCHOLINE CHLORIDE 20 MG/ML IJ SOLN
INTRAMUSCULAR | Status: DC | PRN
  Administered 2017-06-23: 100 mg via INTRAVENOUS

## 2017-06-23 MED ORDER — GABAPENTIN 300 MG PO CAPS
300.0000 mg | ORAL_CAPSULE | ORAL | Status: AC
  Administered 2017-06-23: 300 mg via ORAL

## 2017-06-23 MED ORDER — ALBUTEROL SULFATE (2.5 MG/3ML) 0.083% IN NEBU: 2.5000 mg | INHALATION_SOLUTION | Freq: Four times a day (QID) | RESPIRATORY_TRACT | Status: DC | PRN

## 2017-06-23 MED ORDER — ONDANSETRON HCL 4 MG/2ML IJ SOLN: 4.0000 mg | Freq: Once | INTRAMUSCULAR | Status: DC | PRN

## 2017-06-23 SURGICAL SUPPLY — 29 items
APPLIER CLIP LOGIC TI 5 (MISCELLANEOUS) ×3 IMPLANT
CHLORAPREP W/TINT 26ML (MISCELLANEOUS) ×3 IMPLANT
DERMABOND ADVANCED (GAUZE/BANDAGES/DRESSINGS) ×2
DERMABOND ADVANCED .7 DNX12 (GAUZE/BANDAGES/DRESSINGS) ×1 IMPLANT
GLOVE SURG SYN 7.0 (GLOVE) ×9 IMPLANT
GLOVE SURG SYN 7.5  E (GLOVE) ×2
GLOVE SURG SYN 7.5 E (GLOVE) ×1 IMPLANT
GOWN STRL REUS W/ TWL LRG LVL3 (GOWN DISPOSABLE) ×2 IMPLANT
GOWN STRL REUS W/TWL LRG LVL3 (GOWN DISPOSABLE) ×4
IRRIGATION STRYKERFLOW (MISCELLANEOUS) ×1 IMPLANT
IRRIGATOR STRYKERFLOW (MISCELLANEOUS) ×3
LABEL OR SOLS (LABEL) ×3 IMPLANT
MESH 3DMAX 3X5 LT MED (Mesh General) ×3 IMPLANT
NEEDLE HYPO 22GX1.5 SAFETY (NEEDLE) ×3 IMPLANT
NS IRRIG 500ML POUR BTL (IV SOLUTION) ×3 IMPLANT
PACK LAP CHOLECYSTECTOMY (MISCELLANEOUS) ×3 IMPLANT
PENCIL ELECTRO HAND CTR (MISCELLANEOUS) ×3 IMPLANT
SCISSORS METZENBAUM CVD 33 (INSTRUMENTS) ×3 IMPLANT
SLEEVE ADV FIXATION 5X100MM (TROCAR) ×6 IMPLANT
SUT MNCRL 4-0 (SUTURE) ×2
SUT MNCRL 4-0 27XMFL (SUTURE) ×1
SUT VICRYL 0 AB UR-6 (SUTURE) ×3 IMPLANT
SUTURE MNCRL 4-0 27XMF (SUTURE) ×1 IMPLANT
TACKER 5MM HERNIA 3.5CML NAB (ENDOMECHANICALS) ×3 IMPLANT
TRAY FOLEY W/METER SILVER 16FR (SET/KITS/TRAYS/PACK) ×3 IMPLANT
TROCAR BLUNT TIP 12MM OMST12BT (TROCAR) ×3 IMPLANT
TROCAR Z-THREAD OPTICAL 5X100M (TROCAR) ×3 IMPLANT
TUBING INSUFFLATION (TUBING) ×3 IMPLANT
WATER STERILE IRR 1000ML POUR (IV SOLUTION) ×3 IMPLANT

## 2017-06-23 NOTE — Interval H&P Note (Signed)
History and Physical Interval Note:  06/23/2017 9:41 AM  Jesus Dean  has presented today for surgery, with the diagnosis of recurrent left inguinal hernia  The various methods of treatment have been discussed with the patient and family. After consideration of risks, benefits and other options for treatment, the patient has consented to  Procedure(s): LAPAROSCOPIC INGUINAL HERNIA (Left) as a surgical intervention .  The patient's history has been reviewed, patient examined, no change in status, stable for surgery.  I have reviewed the patient's chart and labs.  Questions were answered to the patient's satisfaction.     Daren Yeagle

## 2017-06-23 NOTE — Addendum Note (Signed)
Addendum  created 06/23/17 1620 by Osha Rane, Precious Haws, MD   Intraprocedure Event edited

## 2017-06-23 NOTE — Anesthesia Preprocedure Evaluation (Addendum)
Anesthesia Evaluation  Patient identified by MRN, date of birth, ID band Patient awake    Reviewed: Allergy & Precautions, NPO status , Patient's Chart, lab work & pertinent test results, reviewed documented beta blocker date and time   Airway Mallampati: III  TM Distance: >3 FB     Dental  (+) Chipped   Pulmonary asthma , sleep apnea ,           Cardiovascular hypertension, Pt. on medications and Pt. on home beta blockers      Neuro/Psych  Headaches,    GI/Hepatic GERD  Controlled,  Endo/Other  Hypothyroidism   Renal/GU      Musculoskeletal   Abdominal   Peds  Hematology   Anesthesia Other Findings Obese.  Reproductive/Obstetrics                            Anesthesia Physical Anesthesia Plan  ASA: III  Anesthesia Plan: General   Post-op Pain Management:    Induction: Intravenous  PONV Risk Score and Plan:   Airway Management Planned: Oral ETT  Additional Equipment:   Intra-op Plan:   Post-operative Plan:   Informed Consent: I have reviewed the patients History and Physical, chart, labs and discussed the procedure including the risks, benefits and alternatives for the proposed anesthesia with the patient or authorized representative who has indicated his/her understanding and acceptance.     Plan Discussed with: CRNA  Anesthesia Plan Comments:         Anesthesia Quick Evaluation

## 2017-06-23 NOTE — Transfer of Care (Signed)
Immediate Anesthesia Transfer of Care Note  Patient: Jesus Dean  Procedure(s) Performed: LAPAROSCOPIC INGUINAL HERNIA (Left )  Patient Location: PACU  Anesthesia Type:General  Level of Consciousness: sedated  Airway & Oxygen Therapy: Patient Spontanous Breathing and Patient connected to face mask oxygen  Post-op Assessment: Report given to RN and Post -op Vital signs reviewed and stable  Post vital signs: Reviewed and stable  Last Vitals:  Vitals:   06/23/17 1349 06/23/17 1419  BP: 116/75   Pulse: 60   Resp:    Temp: (!) 36.4 C   SpO2: 97% 98%    Last Pain:  Vitals:   06/23/17 1420  TempSrc:   PainSc: 10-Worst pain ever         Complications: No apparent anesthesia complications

## 2017-06-23 NOTE — Anesthesia Procedure Notes (Signed)
Procedure Name: Intubation Date/Time: 06/23/2017 11:08 AM Performed by: Justus Memory, CRNA Pre-anesthesia Checklist: Patient identified, Patient being monitored, Timeout performed, Emergency Drugs available and Suction available Patient Re-evaluated:Patient Re-evaluated prior to induction Oxygen Delivery Method: Circle system utilized Preoxygenation: Pre-oxygenation with 100% oxygen Induction Type: IV induction Ventilation: Mask ventilation without difficulty Laryngoscope Size: Mac and 3 Grade View: Grade II Tube type: Oral Tube size: 7.0 mm Number of attempts: 1 Airway Equipment and Method: Stylet Placement Confirmation: ETT inserted through vocal cords under direct vision,  positive ETCO2 and breath sounds checked- equal and bilateral Secured at: 21 cm Tube secured with: Tape Dental Injury: Teeth and Oropharynx as per pre-operative assessment

## 2017-06-23 NOTE — Anesthesia Postprocedure Evaluation (Signed)
Anesthesia Post Note  Patient: Jesus Dean  Procedure(s) Performed: LAPAROSCOPIC INGUINAL HERNIA (Left )  Patient location during evaluation: PACU Anesthesia Type: General Level of consciousness: awake and alert Pain management: pain level controlled Vital Signs Assessment: post-procedure vital signs reviewed and stable Respiratory status: spontaneous breathing, nonlabored ventilation, respiratory function stable and patient connected to nasal cannula oxygen Cardiovascular status: blood pressure returned to baseline and stable Postop Assessment: no apparent nausea or vomiting Anesthetic complications: no     Last Vitals:  Vitals:   06/23/17 1434 06/23/17 1449  BP: 103/66 103/72  Pulse: (!) 50 (!) 54  Resp:    Temp:    SpO2: 97% 98%    Last Pain:  Vitals:   06/23/17 1449  TempSrc:   PainSc: [place] S

## 2017-06-23 NOTE — Anesthesia Post-op Follow-up Note (Signed)
Anesthesia QCDR form completed.        

## 2017-06-23 NOTE — Op Note (Signed)
  Procedure Date:  06/23/2017  Pre-operative Diagnosis:   Recurrent left inguinal hernia  Post-operative Diagnosis:  Recurrent left inguinal hernia  Procedure:  Laparoscopic left Inguinal Hernia Repair  Surgeon:  Melvyn Neth, MD  Anesthesia:  General endotracheal  Estimated Blood Loss:  30 ml  Specimens:  None  Complications:  Left epigastric bleeding  Indications for Procedure:  This is a 56 y.o. male who presents with a recurrent left inguinal hernia.  The options of surgery versus observation were reviewed with the patient and/or family. The risks of bleeding, abscess or infection, recurrence of symptoms, potential for an open procedure, injury to surrounding structures, and chronic pain were all discussed with the patient and was willing to proceed.  Description of Procedure: The patient was correctly identified in the preoperative area and brought into the operating room.  The patient was placed supine with VTE prophylaxis in place.  Appropriate time-outs were performed.  Anesthesia was induced and the patient was intubated.  Foley catheter was placed.  Appropriate antibiotics were infused.  The abdomen was prepped and draped in a sterile fashion. An infraumbilical incision was made. A cutdown technique was used to enter the abdominal cavity without injury, and a Hasson trocar was inserted.  Pneumoperitoneum was obtained with appropriate opening pressures.  Two 5-mm ports were placed in the right and left lateral positions under direct visualization.  The patient was placed in Trendelenburg position.  He did have two episodes of bradycardia which required repositioning and release of pneumoperitoneum, but on third attempt, the patient remained stable and it was decided to continue with the planned procedure.  The left lower quadrant had adhesions, particularly a section of the sigmoid colon which was adhered.  These were taken down sharply and with electrocautery without  complications.  Both inguinal regions were inspected for hernias and it was confirmed that the patient had a left inguinal hernia.  Using electocautery, the peritoneum on the left side was scored from the median umbilical ligament laterally towards the ASIS.  The peritoneum was dissected off from the abdominal wall exposing the pubic bone and Cooper's ligament, spermatic cord and related structures. Upon dissection, it was noted that mesh from prior repair was significantly adhered to the inside portion of the peritoneum, and upon dissection, there was tearing of the peritoneal layer.  With further dissection around the mesh, there was an injury to the epigastric artery which caused bleeding.  This was controlled successfully with clips proximally and distally to the bleeding. The iliac vessels were intact.  Suction irrigator was used to suction the blood and clean the area.  The hernia sac and contents were reduced preserving all structures.  A Bard Ventralight 3D mesh was placed via the umbilical port and was tacked medially, laterally, and superiorly in place.  The peritoneum was then closed over using tacks as well.    The 5 mm ports were removed under direct visualization and the Hasson trocar was removed.  The fascial opening was closed using 0 vicryl suture.  Local anesthetic was infused in all incisions and the incisions were closed with 4-0 Monocryl.  The wounds were cleaned and sealed with DermaBond.  Foley catheter was removed and the patient was emerged from anesthesia and extubated and brought to the recovery room for further management.  The patient tolerated the procedure well other than the initial bradycardia and all counts were correct at the end of the case.   Melvyn Neth, MD

## 2017-06-24 ENCOUNTER — Encounter: Payer: Self-pay | Admitting: Surgery

## 2017-06-24 DIAGNOSIS — K4091 Unilateral inguinal hernia, without obstruction or gangrene, recurrent: Secondary | ICD-10-CM | POA: Diagnosis not present

## 2017-06-24 LAB — HEMOGLOBIN AND HEMATOCRIT, BLOOD
HEMATOCRIT: 37.2 % — AB (ref 40.0–52.0)
HEMOGLOBIN: 13.2 g/dL (ref 13.0–18.0)

## 2017-06-24 MED ORDER — DIPHENHYDRAMINE HCL 25 MG PO CAPS: 25.0000 mg | ORAL_CAPSULE | Freq: Four times a day (QID) | ORAL | 0 refills | Status: DC | PRN

## 2017-06-24 MED ORDER — DIPHENHYDRAMINE HCL 25 MG PO CAPS
25.0000 mg | ORAL_CAPSULE | Freq: Four times a day (QID) | ORAL | Status: DC | PRN
  Administered 2017-06-24: 25 mg via ORAL

## 2017-06-24 MED ORDER — ACETAMINOPHEN 500 MG PO TABS: 1000.0000 mg | ORAL_TABLET | Freq: Four times a day (QID) | ORAL | 0 refills | Status: DC | PRN

## 2017-06-24 MED ORDER — KETOROLAC TROMETHAMINE 30 MG/ML IJ SOLN
30.0000 mg | Freq: Four times a day (QID) | INTRAMUSCULAR | Status: DC
  Administered 2017-06-24: 30 mg via INTRAVENOUS
  Filled 2017-06-24: qty 1

## 2017-06-24 MED ORDER — IBUPROFEN 600 MG PO TABS: 600.0000 mg | ORAL_TABLET | Freq: Three times a day (TID) | ORAL | 0 refills | Status: DC | PRN

## 2017-06-24 MED ORDER — DIPHENHYDRAMINE HCL 25 MG PO CAPS
ORAL_CAPSULE | ORAL | Status: AC
  Filled 2017-06-24: qty 1

## 2017-06-24 NOTE — Progress Notes (Signed)
Patient discharge teaching given, including activity, diet, follow-up appoints, and medications. Patient verbalized understanding of all discharge instructions. IV access was d/c'd. Vitals are stable. Skin is intact except as charted in most recent assessments. Pt is awaiting ride to arrive, to be driven home by family. Report and discharge information and status have been given to oncoming nurse.   Jesus Dean [employer]

## 2017-06-24 NOTE — Discharge Summary (Signed)
Patient ID: Jesus Dean MRN: [id number] DOB/AGE: 1960-08-30 56 y.o.  Admit date: 06/23/2017 Discharge date: 06/24/2017   Discharge Diagnoses:  Active Problems:   Recurrent inguinal hernia of left side without obstruction or gangrene   Recurrent left inguinal hernia   Procedures:  Laparoscopic left inguinal hernia repair with mesh  Hospital Course:  Patient underwent a laparoscopic left inguinal hernia repair on 12/18 and was admitted for observation overnight for pain control issues.  On 12/19 he was feeling better and pain was better controlled on combined regimen of oxycodone and tylenol.  Motrin will be added for home as well.  He was tolerating diet, ambulating and voiding without issues.  On exam, he was in no acute distress with stable vital signs.  His abdomen was soft, non-distended, and appropriately tender to palpation.  Incisions were clean, dry, and intact without any ecchymosis or hernia recurrence.  He was deemed ready for discharge.  Consults:  None  Disposition: 01-Home or Self Care  Discharge Instructions    Call MD for:  difficulty breathing, headache or visual disturbances   Complete by:  As directed    Call MD for:  persistant nausea and vomiting   Complete by:  As directed    Call MD for:  redness, tenderness, or signs of infection (pain, swelling, redness, odor or green/yellow discharge around incision site)   Complete by:  As directed    Call MD for:  severe uncontrolled pain   Complete by:  As directed    Call MD for:  temperature >100.4   Complete by:  As directed    Diet - low sodium heart healthy   Complete by:  As directed    Discharge instructions   Complete by:  As directed    1.  Patient may shower, but do not scrub wound heavily and dab dry only. 2.  Do not submerge wounds in pool/tub 3.  Do not apply ointments or hydrogen peroxide to the wounds. 4.  It is normal to have bruising and swelling over the low abdomen, groin, and scrotum area.   This is normal and will dissipate slowly on its own. 5.  Do not take NSAIDs (Aspirin, Advil, Aleve, Ibuprofen, Naproxen) for 48 hours.   Driving Restrictions   Complete by:  As directed    Do not drive while taking narcotics for pain control.   Increase activity slowly   Complete by:  As directed    Lifting restrictions   Complete by:  As directed    No heavy lifting or pushing of more than 10-15 lbs for 4 weeks.   No dressing needed   Complete by:  As directed      Allergies as of 06/24/2017   No Known Allergies     Medication List    STOP taking these medications   amoxicillin 875 MG tablet Commonly known as:  AMOXIL     TAKE these medications   acetaminophen 500 MG tablet Commonly known as:  TYLENOL Take 2 tablets (1,000 mg total) by mouth every 6 (six) hours as needed for mild pain, fever or headache.   albuterol 108 (90 Base) MCG/ACT inhaler Commonly known as:  PROVENTIL HFA;VENTOLIN HFA Inhale 2 puffs into the lungs every 6 (six) hours as needed for wheezing or shortness of breath.   atenolol 25 MG tablet Commonly known as:  TENORMIN Take 25 mg by mouth at bedtime.   diphenhydrAMINE 25 mg capsule Commonly known as:  BENADRYL Take 1 capsule (  25 mg total) by mouth every 6 (six) hours as needed for itching, allergies or sleep.   famotidine 10 MG chewable tablet Commonly known as:  PEPCID AC Chew 10 mg by mouth as needed for heartburn.   ibuprofen 600 MG tablet Commonly known as:  ADVIL,MOTRIN Take 1 tablet (600 mg total) by mouth every 8 (eight) hours as needed for headache or moderate pain. What changed:    medication strength  how much to take  when to take this   levothyroxine 200 MCG tablet Commonly known as:  SYNTHROID, LEVOTHROID Take 200 mcg by mouth at bedtime.   oxyCODONE 5 MG immediate release tablet Commonly known as:  Oxy IR/ROXICODONE Take 1-2 tablets (5-10 mg total) by mouth every 6 (six) hours as needed for severe pain.       Follow-up Information    Olean Ree, MD Follow up on 07/13/2017.   Specialty:  Surgery Why:  Appointment on 07/13/17.  Please arrive by 10:15 am. Contact information: [address] [place] [address] [phone]

## 2017-06-25 LAB — HIV ANTIBODY (ROUTINE TESTING W REFLEX): HIV Screen 4th Generation wRfx: NONREACTIVE

## 2017-06-26 NOTE — Telephone Encounter (Signed)
Called made to patient at this time. I left a message for the patient to call back with his last day of work date prior to his surgery for his FMLA paperwork.

## 2017-07-03 ENCOUNTER — Encounter: Payer: Self-pay | Admitting: Surgery

## 2017-07-08 ENCOUNTER — Other Ambulatory Visit: Payer: Self-pay

## 2017-07-09 ENCOUNTER — Encounter: Payer: BLUE CROSS/BLUE SHIELD | Admitting: Surgery

## 2017-07-13 ENCOUNTER — Encounter: Payer: BLUE CROSS/BLUE SHIELD | Admitting: Surgery

## 2017-07-23 ENCOUNTER — Encounter: Payer: Self-pay | Admitting: Surgery

## 2017-07-23 ENCOUNTER — Ambulatory Visit (INDEPENDENT_AMBULATORY_CARE_PROVIDER_SITE_OTHER): Payer: BLUE CROSS/BLUE SHIELD | Admitting: Surgery

## 2017-07-23 VITALS — BP 122/75 | HR 92 | Temp 98.2°F | Ht 72.0 in | Wt 231.2 lb

## 2017-07-23 DIAGNOSIS — Z09 Encounter for follow-up examination after completed treatment for conditions other than malignant neoplasm: Secondary | ICD-10-CM

## 2017-07-23 NOTE — Patient Instructions (Signed)

## 2017-07-23 NOTE — Progress Notes (Signed)
S/p left IH lap by Dr. Hampton Abbot 12/18 Doing well Minimal pain  PE NAD Abd: soft, incisions c/d/i. No recurrence  A/p Doing well RTW End of the month  RTC prn

## 2019-05-28 ENCOUNTER — Other Ambulatory Visit: Payer: Self-pay

## 2019-05-28 DIAGNOSIS — Z20822 Contact with and (suspected) exposure to covid-19: Secondary | ICD-10-CM

## 2019-05-30 LAB — NOVEL CORONAVIRUS, NAA: SARS-CoV-2, NAA: NOT DETECTED

## 2020-06-09 ENCOUNTER — Other Ambulatory Visit: Payer: Self-pay

## 2020-06-09 ENCOUNTER — Encounter: Payer: Self-pay | Admitting: Emergency Medicine

## 2020-06-09 ENCOUNTER — Ambulatory Visit
Admission: EM | Admit: 2020-06-09 | Discharge: 2020-06-09 | Disposition: A | Discharge status: Home / Self Care | Payer: BC Managed Care – PPO | Attending: Family Medicine | Admitting: Family Medicine

## 2020-06-09 DIAGNOSIS — J069 Acute upper respiratory infection, unspecified: Secondary | ICD-10-CM | POA: Insufficient documentation

## 2020-06-09 LAB — GROUP A STREP BY PCR: Group A Strep by PCR: NOT DETECTED

## 2020-06-09 NOTE — ED Provider Notes (Signed)
[facility]    CSN: [id number] Arrival date & time: 06/09/20  1024      History   Chief Complaint Chief Complaint  Patient presents with  . Cough  . Nasal Congestion   HPI 59 year old male presents with respiratory symptoms.  Patient reports that he has been sick since Tuesday.  Reports sore throat, watery eyes, runny nose, congestion, cough.  Patient reports exposure to an individual with strep throat.  Declines Covid testing today.  No fever.  Desires strep testing.  No other associated symptoms.  No other complaints.  Past Medical History:  Diagnosis Date  . Asthma   . GERD (gastroesophageal reflux disease)    OCC  . History of migraine headaches   . Hypothyroidism   . Hypothyroidism, unspecified 12/10/2016  . Migraine headache 12/10/2016  . Neuropathy 09/11/2015   Last Assessment & Plan:  Denies any new complaints.    . Peyronie disease   . Rectal bleeding   . Routine history and physical examination of adult 04/04/2015   Overview:  Prevnar 2016.  Last Assessment & Plan:  He is agreeable to colonoscopy.   Ordered colonoscopy. Check labs today.  . Sleep apnea    DOES NOT HAVE CPAP-HAD THE 1ST PART OF THE TEST DONE AND DID NOT GO BACK FOR THE 2ND PART    Patient Active Problem List   Diagnosis Date Noted  . Recurrent left inguinal hernia 06/23/2017  . Chest pain on breathing 03/23/2017  . Recurrent inguinal hernia of left side without obstruction or gangrene 12/15/2016  . Hypothyroidism, unspecified 12/10/2016  . Rectal bleeding 12/10/2016  . Peyronie disease 12/10/2016  . Cough 02/06/2016  . Neuropathy 09/11/2015  . Rash 05/02/2015  . Asthma 04/04/2015  . Hypothyroidism 04/04/2015  . Midline low back pain without sciatica 04/04/2015  . Routine history and physical examination of adult 04/04/2015  . Migraine without aura 04/04/2015  . Lipoma of other skin and subcutaneous tissue 05/15/2013    Past Surgical History:  Procedure Laterality Date  .  hernia Left   . HERNIA REPAIR Right   . INGUINAL HERNIA REPAIR Left 06/23/2017   Procedure: LAPAROSCOPIC INGUINAL HERNIA;  Surgeon: Olean Ree, MD;  Location: [facility] ORS;  Service: General;  Laterality: Left;  . INNER EAR SURGERY     MULTIPLE SURGERIES  . THYROIDECTOMY    . TONSILLECTOMY AND ADENOIDECTOMY    . VASECTOMY     Home Medications    Prior to Admission medications   Medication Sig Start Date End Date Taking? Authorizing Provider  atenolol (TENORMIN) 25 MG tablet Take 25 mg by mouth at bedtime.  08/14/16 06/09/20 Yes [provider]  famotidine (PEPCID AC) 10 MG chewable tablet Chew 10 mg by mouth as needed for heartburn.   Yes [provider]  levothyroxine (SYNTHROID, LEVOTHROID) 200 MCG tablet Take 200 mcg by mouth at bedtime.  07/18/16  Yes [provider]  acetaminophen (TYLENOL) 500 MG tablet Take 2 tablets (1,000 mg total) by mouth every 6 (six) hours as needed for mild pain, fever or headache. Patient not taking: Reported on 07/23/2017 06/24/17   Olean Ree, MD  albuterol (PROVENTIL HFA;VENTOLIN HFA) 108 (90 Base) MCG/ACT inhaler Inhale 2 puffs into the lungs every 6 (six) hours as needed for wheezing or shortness of breath.  07/18/16   [provider]  diphenhydrAMINE (BENADRYL) 25 mg capsule Take 1 capsule (25 mg total) by mouth every 6 (six) hours as needed for itching, allergies or sleep. 06/24/17  Olean Ree, MD  ibuprofen (ADVIL,MOTRIN) 600 MG tablet Take 1 tablet (600 mg total) by mouth every 8 (eight) hours as needed for headache or moderate pain. 06/24/17   Olean Ree, MD    Family History Family History  Problem Relation Age of Onset  . COPD Father   . Cancer Father        Colon  . Lung cancer Father   . Prostate cancer Father   . Hypothyroidism Father   . COPD Mother   . Hypertension Mother   . Hypothyroidism Mother   . Brain cancer Sister     Social History Social History   Tobacco Use  . Smoking status:  Never Smoker  . Smokeless tobacco: Never Used  Vaping Use  . Vaping Use: Never used  Substance Use Topics  . Alcohol use: No  . Drug use: No     Allergies   Patient has no known allergies.   Review of Systems Review of Systems Per HPI  Physical Exam Triage Vital Signs ED Triage Vitals  Enc Vitals Group     BP 06/09/20 1107 126/88     Pulse Rate 06/09/20 1107 65     Resp 06/09/20 1107 16     Temp 06/09/20 1107 98.3 F (36.8 C)     Temp Source 06/09/20 1107 Oral     SpO2 06/09/20 1107 97 %     Weight 06/09/20 1048 236 lb (107 kg)     Height 06/09/20 1048 6' (1.829 m)     Head Circumference --      Peak Flow --      Pain Score 06/09/20 1048 3     Pain Loc --      Pain Edu? --      Excl. in [place]? --    No data found.  Updated Vital Signs BP 126/88 (BP Location: Left Arm)   Pulse 65   Temp 98.3 F (36.8 C) (Oral)   Resp 16   Ht 6' (1.829 m)   Wt 107 kg   SpO2 97%   BMI 32.01 kg/m   Visual Acuity Right Eye Distance:   Left Eye Distance:   Bilateral Distance:    Right Eye Near:   Left Eye Near:    Bilateral Near:     Physical Exam Vitals and nursing note reviewed.  Constitutional:      General: He is not in acute distress.    Appearance: Normal appearance. He is not ill-appearing.  HENT:     Head: Normocephalic and atraumatic.     Mouth/Throat:     Pharynx: Oropharynx is clear. No oropharyngeal exudate.  Eyes:     General:        Right eye: No discharge.        Left eye: No discharge.     Conjunctiva/sclera: Conjunctivae normal.  Cardiovascular:     Rate and Rhythm: Normal rate and regular rhythm.  Pulmonary:     Effort: Pulmonary effort is normal.     Breath sounds: Normal breath sounds. No wheezing, rhonchi or rales.  Neurological:     Mental Status: He is alert.  Psychiatric:        Mood and Affect: Mood normal.        Behavior: Behavior normal.    UC Treatments / Results  Labs (all labs ordered are listed, but only abnormal results  are displayed) Labs Reviewed  GROUP A STREP BY PCR    EKG   Radiology No  results found.  Procedures Procedures (including critical care time)  Medications Ordered in UC Medications - No data to display  Initial Impression / Assessment and Plan / UC Course  I have reviewed the triage vital signs and the nursing notes.  Pertinent labs & imaging results that were available during my care of the patient were reviewed by me and considered in my medical decision making (see chart for details).    59 year old male presents with a viral URI with cough.  Strep negative.  Declines Covid testing.  Supportive care with over-the-counter medication as needed.  Final Clinical Impressions(s) / UC Diagnoses   Final diagnoses:  Viral URI with cough     Discharge Instructions     We will call with strep results.  This is likely a respiratory virus.  OTC medication as needed for symptom relief.  Take care  Dr. Lacinda Axon    ED Prescriptions    None     PDMP not reviewed this encounter.   Coral Spikes, [place] 06/09/20 1625

## 2020-06-09 NOTE — ED Triage Notes (Addendum)
Patient c/o sore throat, watery eyes, nasal congestion, cough that started Tuesday.  Patient denies fevers.  Patient states that he only wants a strep test today.

## 2020-06-09 NOTE — Discharge Instructions (Addendum)
We will call with strep results.  This is likely a respiratory virus.  OTC medication as needed for symptom relief.  Take care  Dr. Lacinda Axon

## 2020-08-10 ENCOUNTER — Encounter: Payer: Self-pay | Admitting: Otolaryngology

## 2020-08-21 ENCOUNTER — Other Ambulatory Visit
Admission: RE | Admit: 2020-08-21 | Discharge: 2020-08-21 | Disposition: A | Discharge status: Home / Self Care | Payer: BLUE CROSS/BLUE SHIELD | Source: Ambulatory Visit | Attending: Otolaryngology | Admitting: Otolaryngology

## 2020-08-21 NOTE — Pre-Procedure Instructions (Signed)
Patient did not require covid testing, as per protocol, since he was positive on 07/28/20.

## 2020-08-22 NOTE — Discharge Instructions (Signed)
[facility] DISCHARGE INSTRUCTIONS FOR MYRINGOTOMY AND TUBE INSERTION  [facility] Margaretha Sheffield, M.D.  Diet:   After surgery, the patient should take only liquids and foods as tolerated.  The patient may then have a regular diet after the effects of anesthesia have worn off, usually about four to six hours after surgery.  Activities:   The patient should rest until the effects of anesthesia have worn off.  After this, there are no restrictions on the normal daily activities.  Medications:   You will be given antibiotic drops to be used in the ears postoperatively.  It is recommended to use 3 drops 3 times a day for 3 days, then the drops should be saved for possible future use.  The tubes should not cause any discomfort to the patient, but if there is any question, Tylenol should be given according to the instructions for the age of the patient.  Other medications should be continued normally.  Precautions:   Should there be recurrent drainage after the tubes are placed, the drops should be used for approximately 3-4 days.  If it does not clear, you should call the ENT office.  Earplugs:   Earplugs are only needed for those who are going to be submerged under water.  When taking a bath or shower and using a cup or showerhead to rinse hair, it is not necessary to wear earplugs.  These come in a variety of fashions, all of which can be obtained at our office.  However, if one is not able to come by the office, then silicone plugs can be found at most pharmacies.  It is not advised to stick anything in the ear that is not approved as an earplug.  Silly putty is not to be used as an earplug.  Swimming is allowed in patients after ear tubes are inserted, however, they must wear earplugs if they are going to be submerged under water.  For those children who are going to be swimming a lot, it is recommended to use a fitted ear mold, which can be made by our audiologist.   If discharge is noticed from the ears, this most likely represents an ear infection.  We would recommend getting your eardrops and using them as indicated above.  If it does not clear, then you should call the ENT office.  For follow up, the patient should return to the ENT office three weeks postoperatively and then every six months as required by the doctor.   General Anesthesia, Adult, Care After This sheet gives you information about how to care for yourself after your procedure. Your health care provider may also give you more specific instructions. If you have problems or questions, contact your health care provider. What can I expect after the procedure? After the procedure, the following side effects are common:  Pain or discomfort at the IV site.  Nausea.  Vomiting.  Sore throat.  Trouble concentrating.  Feeling cold or chills.  Feeling weak or tired.  Sleepiness and fatigue.  Soreness and body aches. These side effects can affect parts of the body that were not involved in surgery. Follow these instructions at home: For the time period you were told by your health care provider:  Rest.  Do not participate in activities where you could fall or become injured.  Do not drive or use machinery.  Do not drink alcohol.  Do not take sleeping pills or medicines that cause drowsiness.  Do  not make important decisions or sign legal documents.  Do not take care of children on your own.   Eating and drinking  Follow any instructions from your health care provider about eating or drinking restrictions.  When you feel hungry, start by eating small amounts of foods that are soft and easy to digest (bland), such as toast. Gradually return to your regular diet.  Drink enough fluid to keep your urine pale yellow.  If you vomit, rehydrate by drinking water, juice, or clear broth. General instructions  If you have sleep apnea, surgery and certain medicines can increase your  risk for breathing problems. Follow instructions from your health care provider about wearing your sleep device: ? Anytime you are sleeping, including during daytime naps. ? While taking prescription pain medicines, sleeping medicines, or medicines that make you drowsy.  Have a responsible adult stay with you for the time you are told. It is important to have someone help care for you until you are awake and alert.  Return to your normal activities as told by your health care provider. Ask your health care provider what activities are safe for you.  Take over-the-counter and prescription medicines only as told by your health care provider.  If you smoke, do not smoke without supervision.  Keep all follow-up visits as told by your health care provider. This is important. Contact a health care provider if:  You have nausea or vomiting that does not get better with medicine.  You cannot eat or drink without vomiting.  You have pain that does not get better with medicine.  You are unable to pass urine.  You develop a skin rash.  You have a fever.  You have redness around your IV site that gets worse. Get help right away if:  You have difficulty breathing.  You have chest pain.  You have blood in your urine or stool, or you vomit blood. Summary  After the procedure, it is common to have a sore throat or nausea. It is also common to feel tired.  Have a responsible adult stay with you for the time you are told. It is important to have someone help care for you until you are awake and alert.  When you feel hungry, start by eating small amounts of foods that are soft and easy to digest (bland), such as toast. Gradually return to your regular diet.  Drink enough fluid to keep your urine pale yellow.  Return to your normal activities as told by your health care provider. Ask your health care provider what activities are safe for you. This information is not intended to replace  advice given to you by your health care provider. Make sure you discuss any questions you have with your health care provider. Document Revised: 03/08/2020 Document Reviewed: 10/06/2019 Elsevier Patient Education  2021 [employer].

## 2020-08-23 ENCOUNTER — Other Ambulatory Visit: Payer: Self-pay

## 2020-08-23 ENCOUNTER — Encounter
Admission: RE | Disposition: A | Discharge status: Home / Self Care | Payer: Self-pay | Source: Home / Self Care | Attending: Otolaryngology

## 2020-08-23 ENCOUNTER — Ambulatory Visit: Payer: BC Managed Care – PPO | Admitting: Anesthesiology

## 2020-08-23 ENCOUNTER — Ambulatory Visit
Admission: RE | Admit: 2020-08-23 | Discharge: 2020-08-23 | Disposition: A | Discharge status: Home / Self Care | Payer: BC Managed Care – PPO | Attending: Otolaryngology | Admitting: Otolaryngology

## 2020-08-23 ENCOUNTER — Encounter: Payer: Self-pay | Admitting: Otolaryngology

## 2020-08-23 DIAGNOSIS — Z79890 Hormone replacement therapy (postmenopausal): Secondary | ICD-10-CM | POA: Diagnosis not present

## 2020-08-23 DIAGNOSIS — H6983 Other specified disorders of Eustachian tube, bilateral: Secondary | ICD-10-CM | POA: Diagnosis present

## 2020-08-23 DIAGNOSIS — Z79899 Other long term (current) drug therapy: Secondary | ICD-10-CM | POA: Insufficient documentation

## 2020-08-23 DIAGNOSIS — H6523 Chronic serous otitis media, bilateral: Secondary | ICD-10-CM | POA: Diagnosis not present

## 2020-08-23 HISTORY — DX: Personal history of other diseases of the circulatory system: Z86.79

## 2020-08-23 HISTORY — PX: MYRINGOTOMY WITH TUBE PLACEMENT: SHX5663

## 2020-08-23 SURGERY — MYRINGOTOMY WITH TUBE PLACEMENT
Anesthesia: General | Site: Ear | Laterality: Bilateral

## 2020-08-23 MED ORDER — FENTANYL CITRATE (PF) 100 MCG/2ML IJ SOLN: 25.0000 ug | INTRAMUSCULAR | Status: DC | PRN

## 2020-08-23 MED ORDER — MEPERIDINE HCL 25 MG/ML IJ SOLN: 6.2500 mg | INTRAMUSCULAR | Status: DC | PRN

## 2020-08-23 MED ORDER — CIPROFLOXACIN-DEXAMETHASONE 0.3-0.1 % OT SUSP
OTIC | Status: DC | PRN
  Administered 2020-08-23: 1 [drp] via OTIC

## 2020-08-23 MED ORDER — ONDANSETRON HCL 4 MG/2ML IJ SOLN
INTRAMUSCULAR | Status: DC | PRN
  Administered 2020-08-23: 4 mg via INTRAVENOUS

## 2020-08-23 MED ORDER — PROMETHAZINE HCL 25 MG/ML IJ SOLN: 6.2500 mg | INTRAMUSCULAR | Status: DC | PRN

## 2020-08-23 MED ORDER — PROPOFOL 10 MG/ML IV BOLUS
INTRAVENOUS | Status: DC | PRN
  Administered 2020-08-23: 70 mg via INTRAVENOUS

## 2020-08-23 MED ORDER — GLYCOPYRROLATE 0.2 MG/ML IJ SOLN
INTRAMUSCULAR | Status: DC | PRN
  Administered 2020-08-23: .1 mg via INTRAVENOUS

## 2020-08-23 MED ORDER — MIDAZOLAM HCL 5 MG/5ML IJ SOLN
INTRAMUSCULAR | Status: DC | PRN
  Administered 2020-08-23: 2 mg via INTRAVENOUS

## 2020-08-23 MED ORDER — LIDOCAINE HCL (CARDIAC) PF 100 MG/5ML IV SOSY
PREFILLED_SYRINGE | INTRAVENOUS | Status: DC | PRN
  Administered 2020-08-23: 50 mg via INTRAVENOUS

## 2020-08-23 MED ORDER — OXYCODONE HCL 5 MG PO TABS: 5.0000 mg | ORAL_TABLET | Freq: Once | ORAL | Status: DC | PRN

## 2020-08-23 MED ORDER — CIPROFLOXACIN-DEXAMETHASONE 0.3-0.1 % OT SUSP: 4.0000 [drp] | Freq: Three times a day (TID) | OTIC | 0 refills | Status: AC

## 2020-08-23 MED ORDER — OXYCODONE HCL 5 MG/5ML PO SOLN: 5.0000 mg | Freq: Once | ORAL | Status: DC | PRN

## 2020-08-23 MED ORDER — LACTATED RINGERS IV SOLN: INTRAVENOUS | Status: DC

## 2020-08-23 SURGICAL SUPPLY — 11 items
BALL CTTN LRG ABS STRL LF (GAUZE/BANDAGES/DRESSINGS) ×1
BLADE MYR LANCE NRW W/HDL (BLADE) ×2 IMPLANT
CANISTER SUCT 1200ML W/VALVE (MISCELLANEOUS) ×2 IMPLANT
COTTONBALL LRG STERILE PKG (GAUZE/BANDAGES/DRESSINGS) ×2 IMPLANT
GLOVE PI ULTRA LF STRL 7.5 (GLOVE) ×1 IMPLANT
GLOVE PI ULTRA NON LATEX 7.5 (GLOVE) ×1
STRAP BODY AND KNEE 60X3 (MISCELLANEOUS) ×2 IMPLANT
TOWEL OR 17X26 4PK STRL BLUE (TOWEL DISPOSABLE) ×2 IMPLANT
TUBE EAR ARMSTRONG FL 1.14X4.5 (OTOLOGIC RELATED) ×4 IMPLANT
TUBING CONN 6MMX3.1M (TUBING) ×1
TUBING SUCTION CONN 0.25 STRL (TUBING) ×1 IMPLANT

## 2020-08-23 NOTE — Anesthesia Postprocedure Evaluation (Signed)
Anesthesia Post Note  Patient: Jesus Dean  Procedure(s) Performed: MYRINGOTOMY WITH TUBE PLACEMENT (Bilateral Ear)     Patient location during evaluation: PACU Anesthesia Type: General Level of consciousness: awake and alert Pain management: pain level controlled Vital Signs Assessment: post-procedure vital signs reviewed and stable Respiratory status: spontaneous breathing, nonlabored ventilation, respiratory function stable and patient connected to nasal cannula oxygen Cardiovascular status: blood pressure returned to baseline and stable Postop Assessment: no apparent nausea or vomiting Anesthetic complications: no   No complications documented.  Mick Tanguma, [place]

## 2020-08-23 NOTE — Anesthesia Preprocedure Evaluation (Signed)
Anesthesia Evaluation  Patient identified by MRN, date of birth, ID band Patient awake    Reviewed: Allergy & Precautions, NPO status , Patient's Chart, lab work & pertinent test results, reviewed documented beta blocker date and time   Airway Mallampati: III  TM Distance: >3 FB     Dental  (+) Chipped   Pulmonary asthma , sleep apnea ,           Cardiovascular hypertension, Pt. on medications and Pt. on home beta blockers      Neuro/Psych  Headaches,    GI/Hepatic GERD  Controlled,  Endo/Other  Hypothyroidism   Renal/GU      Musculoskeletal   Abdominal   Peds  Hematology   Anesthesia Other Findings Obese.  Reproductive/Obstetrics                             Anesthesia Physical  Anesthesia Plan  ASA: III  Anesthesia Plan: General   Post-op Pain Management:    Induction: Intravenous  PONV Risk Score and Plan: 2 and Treatment may vary due to age or medical condition and Propofol infusion  Airway Management Planned: Oral ETT  Additional Equipment:   Intra-op Plan:   Post-operative Plan:   Informed Consent: I have reviewed the patients History and Physical, chart, labs and discussed the procedure including the risks, benefits and alternatives for the proposed anesthesia with the patient or authorized representative who has indicated his/her understanding and acceptance.       Plan Discussed with: CRNA  Anesthesia Plan Comments:         Anesthesia Quick Evaluation

## 2020-08-23 NOTE — Op Note (Signed)
08/23/2020  9:10 AM    Cyndy Freeze  [id number]   Pre-Op Dx: Chronic serous otitis media secondary to eustachian tube dysfunction  Post-op Dx: Chronic serous otitis media secondary to eustachian tube dysfunction, wax impaction in right mastoid bowl  Proc:Bilateral myringotomy with tubes, cleaning of wax and debris from the right mastoid bowl cavity.  Surg: Huey Romans  Anes:  General by mask  EBL:  None  Comp: None  Findings: On the right side his eardrum was retracted and near touching the [place]. There was a small opening in the anterior area with fluid behind it. The mastoid bowl was filled with a large ball of dried hard wax. This was cleaned and debrided from the right mastoid bowl. The left eardrum was retracted and there was glue-like fluid filling the middle ear space. This was all suctioned out and short Armstrong five tubes were placed on both sides.  Procedure: With the patient in a comfortable supine position, general mask anesthesia was administered.  At an appropriate level, microscope and speculum were used to examine and clean the RIGHT ear canal.  The findings were as described above.  An anterior inferior radial myringotomy incision was sharply executed.  Middle ear contents were suctioned clear.  A PE tube was placed without difficulty.  Ciprodex otic solution was instilled into the external canal, and insufflated into the middle ear.  A cotton ball was placed at the external meatus. Hemostasis was observed.  This side was completed.  After completing the RIGHT side, the LEFT side was done in identical fashion.    Following this  The patient was returned to anesthesia, awakened, and transferred to recovery in stable condition.  Dispo:  PACU to home  Plan: Routine drop use and water precautions.  Recheck my office in two to three weeks with audiogram.   Huey Romans 9:10 AM 08/23/2020

## 2020-08-23 NOTE — H&P (Signed)
H&P has been reviewed and patient reevaluated, no changes necessary. To be downloaded later.  

## 2020-08-23 NOTE — Transfer of Care (Signed)
Immediate Anesthesia Transfer of Care Note  Patient: Jesus Dean  Procedure(s) Performed: MYRINGOTOMY WITH TUBE PLACEMENT (Bilateral Ear)  Patient Location: PACU  Anesthesia Type: General  Level of Consciousness: awake, alert  and patient cooperative  Airway and Oxygen Therapy: Patient Spontanous Breathing and Patient connected to supplemental oxygen  Post-op Assessment: Post-op Vital signs reviewed, Patient's Cardiovascular Status Stable, Respiratory Function Stable, Patent Airway and No signs of Nausea or vomiting  Post-op Vital Signs: Reviewed and stable  Complications: No complications documented.

## 2020-08-23 NOTE — Anesthesia Procedure Notes (Signed)
Procedure Name: General with mask airway Performed by: Jamiyah Dingley, CRNA Pre-anesthesia Checklist: Patient identified, Patient being monitored, Emergency Drugs available, Timeout performed and Suction available Patient Re-evaluated:Patient Re-evaluated prior to induction Oxygen Delivery Method: Circle system utilized Preoxygenation: Pre-oxygenation with 100% oxygen Induction Type: Combination inhalational/ intravenous induction Ventilation: Mask ventilation without difficulty Dental Injury: Teeth and Oropharynx as per pre-operative assessment        

## 2020-08-24 ENCOUNTER — Encounter: Payer: Self-pay | Admitting: Otolaryngology

## 2020-11-09 ENCOUNTER — Other Ambulatory Visit: Payer: Self-pay

## 2020-11-09 ENCOUNTER — Ambulatory Visit
Admission: EM | Admit: 2020-11-09 | Discharge: 2020-11-09 | Disposition: A | Discharge status: Home / Self Care | Payer: BC Managed Care – PPO | Attending: Family Medicine | Admitting: Family Medicine

## 2020-11-09 DIAGNOSIS — S61212A Laceration without foreign body of right middle finger without damage to nail, initial encounter: Secondary | ICD-10-CM

## 2020-11-09 NOTE — ED Provider Notes (Signed)
[facility]    CSN: [id number] Arrival date & time: 11/09/20  1615  History   Chief Complaint Chief Complaint  Patient presents with  . Laceration   HPI   60 year old male presents with a laceration.  Patient states that he cut his right middle finger on a piece of glass while unloading items from his truck.  He states that he was unable to stop the bleeding.  He has taped the area.  He reports pain, 7/10 in severity.  His main concern is the fact that it continues to bleed.  Last tetanus was within the last 3 years.  No other reported symptoms.  No other complaints.  Past Medical History:  Diagnosis Date  . Asthma   . GERD (gastroesophageal reflux disease)    OCC  . History of angina   . History of migraine headaches   . Hypothyroidism   . Hypothyroidism, unspecified 12/10/2016  . Migraine headache 12/10/2016  . Neuropathy 09/11/2015   Last Assessment & Plan:  Denies any new complaints.    . Peyronie disease   . Rectal bleeding   . Routine history and physical examination of adult 04/04/2015   Overview:  Prevnar 2016.  Last Assessment & Plan:  He is agreeable to colonoscopy.   Ordered colonoscopy. Check labs today.  . Sleep apnea    DOES NOT HAVE CPAP-HAD THE 1ST PART OF THE TEST DONE AND DID NOT GO BACK FOR THE 2ND PART    Patient Active Problem List   Diagnosis Date Noted  . Recurrent left inguinal hernia 06/23/2017  . Chest pain on breathing 03/23/2017  . Recurrent inguinal hernia of left side without obstruction or gangrene 12/15/2016  . Hypothyroidism, unspecified 12/10/2016  . Rectal bleeding 12/10/2016  . Peyronie disease 12/10/2016  . Cough 02/06/2016  . Neuropathy 09/11/2015  . Rash 05/02/2015  . Asthma 04/04/2015  . Hypothyroidism 04/04/2015  . Midline low back pain without sciatica 04/04/2015  . Routine history and physical examination of adult 04/04/2015  . Migraine without aura 04/04/2015  . Lipoma of other skin and subcutaneous tissue  05/15/2013    Past Surgical History:  Procedure Laterality Date  . hernia Left   . HERNIA REPAIR Right   . INGUINAL HERNIA REPAIR Left 06/23/2017   Procedure: LAPAROSCOPIC INGUINAL HERNIA;  Surgeon: Olean Ree, MD;  Location: [facility];  Service: General;  Laterality: Left;  . INNER EAR SURGERY     MULTIPLE SURGERIES  . MYRINGOTOMY WITH TUBE PLACEMENT Bilateral 08/23/2020   Procedure: MYRINGOTOMY WITH TUBE PLACEMENT;  Surgeon: Margaretha Sheffield, MD;  Location: [place];  Service: ENT;  Laterality: Bilateral;  COVID positive 07/25/20  . THYROIDECTOMY    . TONSILLECTOMY AND ADENOIDECTOMY    . VASECTOMY         Home Medications    Prior to Admission medications   Medication Sig Start Date End Date Taking? Authorizing Provider  albuterol (PROVENTIL HFA;VENTOLIN HFA) 108 (90 Base) MCG/ACT inhaler Inhale 2 puffs into the lungs every 6 (six) hours as needed for wheezing or shortness of breath.  07/18/16  Yes [provider]  atenolol (TENORMIN) 25 MG tablet Take 25 mg by mouth at bedtime.  08/14/16 11/09/20 Yes [provider]  diphenhydrAMINE (BENADRYL) 25 mg capsule Take 1 capsule (25 mg total) by mouth every 6 (six) hours as needed for itching, allergies or sleep. 06/24/17  Yes Piscoya, Jose, MD  famotidine (PEPCID AC) 10 MG chewable tablet Chew 10 mg by mouth as  needed for heartburn.   Yes [provider]  ibuprofen (ADVIL,MOTRIN) 600 MG tablet Take 1 tablet (600 mg total) by mouth every 8 (eight) hours as needed for headache or moderate pain. 06/24/17  Yes Piscoya, Jacqulyn Bath, MD  levothyroxine (SYNTHROID, LEVOTHROID) 200 MCG tablet Take 200 mcg by mouth at bedtime.  07/18/16  Yes [provider]  acetaminophen (TYLENOL) 500 MG tablet Take 2 tablets (1,000 mg total) by mouth every 6 (six) hours as needed for mild pain, fever or headache. Patient not taking: No sig reported 06/24/17   Olean Ree, MD    Family History Family History  Problem Relation  Age of Onset  . COPD Father   . Cancer Father        Colon  . Lung cancer Father   . Prostate cancer Father   . Hypothyroidism Father   . COPD Mother   . Hypertension Mother   . Hypothyroidism Mother   . Brain cancer Sister     Social History Social History   Tobacco Use  . Smoking status: Never Smoker  . Smokeless tobacco: Never Used  Vaping Use  . Vaping Use: Never used  Substance Use Topics  . Alcohol use: No  . Drug use: No     Allergies   Patient has no known allergies.   Review of Systems Review of Systems  Constitutional: Negative.   Skin: Positive for wound.   Physical Exam Triage Vital Signs ED Triage Vitals  Enc Vitals Group     BP 11/09/20 1654 (!) 131/99     Pulse Rate 11/09/20 1654 90     Resp 11/09/20 1654 18     Temp 11/09/20 1654 98.3 F (36.8 C)     Temp Source 11/09/20 1654 Oral     SpO2 11/09/20 1654 97 %     Weight 11/09/20 1650 230 lb (104.3 kg)     Height 11/09/20 1650 6' (1.829 m)     Head Circumference --      Peak Flow --      Pain Score 11/09/20 1650 7     Pain Loc --      Pain Edu? --      Excl. in [place]? --    Updated Vital Signs BP (!) 131/99 (BP Location: Right Arm)   Pulse 90   Temp 98.3 F (36.8 C) (Oral)   Resp 18   Ht 6' (1.829 m)   Wt 104.3 kg   SpO2 97%   BMI 31.19 kg/m   Visual Acuity Right Eye Distance:   Left Eye Distance:   Bilateral Distance:    Right Eye Near:   Left Eye Near:    Bilateral Near:     Physical Exam Vitals and nursing note reviewed.  Constitutional:      General: He is not in acute distress.    Appearance: Normal appearance. He is not ill-appearing.  HENT:     Head: Normocephalic and atraumatic.  Eyes:     General:        Right eye: No discharge.        Left eye: No discharge.     Conjunctiva/sclera: Conjunctivae normal.  Skin:    Comments: Right middle finger -1 cm laceration noted on the palmar aspect below the PIP joint.  Neurological:     Mental Status: He is alert.   Psychiatric:        Mood and Affect: Mood normal.        Behavior: Behavior  normal.    UC Treatments / Results  Labs (all labs ordered are listed, but only abnormal results are displayed) Labs Reviewed - No data to display  EKG   Radiology No results found.  Procedures Laceration Repair  Date/Time: 11/09/2020 6:22 PM Performed by: Coral Spikes, DO Authorized by: Coral Spikes, DO   Consent:    Consent obtained:  Verbal   Consent given by:  Patient Anesthesia:    Anesthesia method:  Local infiltration   Local anesthetic:  Lidocaine 1% WITH epi Laceration details:    Location:  Finger   Finger location:  R long finger   Length (cm):  1 Pre-procedure details:    Preparation:  Patient was prepped and draped in usual sterile fashion Exploration:    Hemostasis achieved with:  Epinephrine and direct pressure   Contaminated: yes   Treatment:    Area cleansed with:  Povidone-iodine   Amount of cleaning:  Standard   Irrigation solution:  Sterile water   Irrigation method:  Syringe   Visualized foreign bodies/material removed: yes   Skin repair:    Repair method:  Sutures   Suture size:  5-0   Suture material:  Nylon   Suture technique:  Simple interrupted   Number of sutures:  2 Approximation:    Approximation:  Close Repair type:    Repair type:  Simple Post-procedure details:    Dressing:  Non-adherent dressing   Procedure completion:  Tolerated well, no immediate complications   (including critical care time)  Medications Ordered in UC Medications - No data to display  Initial Impression / Assessment and Plan / UC Course  I have reviewed the triage vital signs and the nursing notes.  Pertinent labs & imaging results that were available during my care of the patient were reviewed by me and considered in my medical decision making (see chart for details).    60 year old male presents with a laceration.  Repaired as above.  Sutures out in 7 days.  Advised  to keep clean.  Supportive care.  Final Clinical Impressions(s) / UC Diagnoses   Final diagnoses:  Laceration of right middle finger without foreign body without damage to nail, initial encounter   Discharge Instructions   None    ED Prescriptions    None     PDMP not reviewed this encounter.   Coral Spikes, [place] 11/09/20 1829

## 2020-11-09 NOTE — ED Triage Notes (Signed)
Patient states that he cut his right middle finger on glass while unloading today. Laceration is still currently bleeding.

## 2021-02-06 ENCOUNTER — Emergency Department
Admission: EM | Admit: 2021-02-06 | Discharge: 2021-02-06 | Disposition: A | Discharge status: Home / Self Care | Payer: BC Managed Care – PPO | Attending: Emergency Medicine | Admitting: Emergency Medicine

## 2021-02-06 DIAGNOSIS — T782XXA Anaphylactic shock, unspecified, initial encounter: Secondary | ICD-10-CM | POA: Insufficient documentation

## 2021-02-06 DIAGNOSIS — R070 Pain in throat: Secondary | ICD-10-CM | POA: Insufficient documentation

## 2021-02-06 DIAGNOSIS — E039 Hypothyroidism, unspecified: Secondary | ICD-10-CM | POA: Insufficient documentation

## 2021-02-06 DIAGNOSIS — J45909 Unspecified asthma, uncomplicated: Secondary | ICD-10-CM | POA: Insufficient documentation

## 2021-02-06 DIAGNOSIS — Z79899 Other long term (current) drug therapy: Secondary | ICD-10-CM | POA: Insufficient documentation

## 2021-02-06 DIAGNOSIS — R21 Rash and other nonspecific skin eruption: Secondary | ICD-10-CM | POA: Diagnosis present

## 2021-02-06 LAB — COMPREHENSIVE METABOLIC PANEL
ALT: 22 U/L (ref 0–44)
AST: 15 U/L (ref 15–41)
Albumin: 3.2 g/dL — ABNORMAL LOW (ref 3.5–5.0)
Alkaline Phosphatase: 39 U/L (ref 38–126)
Anion gap: 6 (ref 5–15)
BUN: 13 mg/dL (ref 6–20)
CO2: 23 mmol/L (ref 22–32)
Calcium: 8 mg/dL — ABNORMAL LOW (ref 8.9–10.3)
Chloride: 108 mmol/L (ref 98–111)
Creatinine, Ser: 0.61 mg/dL (ref 0.61–1.24)
GFR, Estimated: >60 mL/min (ref >60)
Glucose, Bld: 128 mg/dL — ABNORMAL HIGH (ref 70–99)
Potassium: 3.8 mmol/L (ref 3.5–5.1)
Sodium: 137 mmol/L (ref 135–145)
Total Bilirubin: 0.9 mg/dL (ref 0.3–1.2)
Total Protein: 5 g/dL — ABNORMAL LOW (ref 6.5–8.1)

## 2021-02-06 LAB — CBC WITH DIFFERENTIAL/PLATELET
Abs Immature Granulocytes: 0.05 10*3/uL (ref 0.00–0.07)
Basophils Absolute: 0 10*3/uL (ref 0.0–0.1)
Basophils Relative: 0 %
Eosinophils Absolute: 0.3 10*3/uL (ref 0.0–0.5)
Eosinophils Relative: 3 %
HCT: 43.2 % (ref 39.0–52.0)
Hemoglobin: 16.1 g/dL (ref 13.0–17.0)
Immature Granulocytes: 0 %
Lymphocytes Relative: 18 %
Lymphs Abs: 2.1 10*3/uL (ref 0.7–4.0)
MCH: 31.7 pg (ref 26.0–34.0)
MCHC: 37.3 g/dL — ABNORMAL HIGH (ref 30.0–36.0)
MCV: 85 fL (ref 80.0–100.0)
Monocytes Absolute: 0.8 10*3/uL (ref 0.1–1.0)
Monocytes Relative: 6 %
Neutro Abs: 8.6 10*3/uL — ABNORMAL HIGH (ref 1.7–7.7)
Neutrophils Relative %: 73 %
Platelets: 214 10*3/uL (ref 150–400)
RBC: 5.08 MIL/uL (ref 4.22–5.81)
RDW: 12.1 % (ref 11.5–15.5)
WBC: 11.9 10*3/uL — ABNORMAL HIGH (ref 4.0–10.5)
nRBC: 0 % (ref 0.0–0.2)

## 2021-02-06 MED ORDER — METHYLPREDNISOLONE SODIUM SUCC 125 MG IJ SOLR
125.0000 mg | Freq: Once | INTRAMUSCULAR | Status: AC
  Administered 2021-02-06: 125 mg via INTRAVENOUS
  Filled 2021-02-06: qty 2

## 2021-02-06 MED ORDER — DIPHENHYDRAMINE HCL 50 MG/ML IJ SOLN
50.0000 mg | Freq: Once | INTRAMUSCULAR | Status: AC
  Administered 2021-02-06: 50 mg via INTRAVENOUS
  Filled 2021-02-06: qty 1

## 2021-02-06 MED ORDER — PREDNISONE 10 MG (21) PO TBPK: ORAL_TABLET | ORAL | 0 refills | Status: DC

## 2021-02-06 MED ORDER — DIPHENHYDRAMINE HCL 25 MG PO CAPS: 25.0000 mg | ORAL_CAPSULE | Freq: Four times a day (QID) | ORAL | 0 refills | Status: AC | PRN

## 2021-02-06 MED ORDER — SODIUM CHLORIDE 0.9 % IV BOLUS
1000.0000 mL | Freq: Once | INTRAVENOUS | Status: AC
Start: 2021-02-06 — End: 2021-02-06
  Administered 2021-02-06: 1000 mL via INTRAVENOUS

## 2021-02-06 MED ORDER — EPINEPHRINE 0.3 MG/0.3ML IJ SOAJ
0.3000 mg | Freq: Once | INTRAMUSCULAR | Status: AC
  Administered 2021-02-06: 0.3 mg via INTRAMUSCULAR
  Filled 2021-02-06: qty 0.3

## 2021-02-06 MED ORDER — FAMOTIDINE IN NACL 20-0.9 MG/50ML-% IV SOLN
20.0000 mg | Freq: Once | INTRAVENOUS | Status: AC
  Administered 2021-02-06: 20 mg via INTRAVENOUS
  Filled 2021-02-06: qty 50

## 2021-02-06 MED ORDER — FAMOTIDINE 20 MG PO TABS: 20.0000 mg | ORAL_TABLET | Freq: Two times a day (BID) | ORAL | 1 refills | Status: DC

## 2021-02-06 NOTE — ED Provider Notes (Signed)
[facility]  ____________________________________________  Time seen: Approximately 5:34 PM  I have reviewed the triage vital signs and the nursing notes.   HISTORY  Chief Complaint Rash   Historian Patient     HPI Jesus Dean is a 60 y.o. male presents to the emergency department with urticaria of the lower extremities and lower abdomen and upper back for the past 2 days.  Patient reports that he has taken Benadryl at home with little relief.  He states that today he has developed some throat tightness and difficulty speaking.  No vomiting, diarrhea, syncope or known allergies to patient's knowledge.  No prior history of anaphylaxis.   Past Medical History:  Diagnosis Date   Asthma    GERD (gastroesophageal reflux disease)    OCC   History of angina    History of migraine headaches    Hypothyroidism    Hypothyroidism, unspecified 12/10/2016   Migraine headache 12/10/2016   Neuropathy 09/11/2015   Last Assessment & Plan:  Denies any new complaints.     Peyronie disease    Rectal bleeding    Routine history and physical examination of adult 04/04/2015   Overview:  Prevnar 2016.  Last Assessment & Plan:  He is agreeable to colonoscopy.   Ordered colonoscopy. Check labs today.   Sleep apnea    DOES NOT HAVE CPAP-HAD THE 1ST PART OF THE TEST DONE AND DID NOT GO BACK FOR THE 2ND PART     Immunizations up to date:  Yes.     Past Medical History:  Diagnosis Date   Asthma    GERD (gastroesophageal reflux disease)    OCC   History of angina    History of migraine headaches    Hypothyroidism    Hypothyroidism, unspecified 12/10/2016   Migraine headache 12/10/2016   Neuropathy 09/11/2015   Last Assessment & Plan:  Denies any new complaints.     Peyronie disease    Rectal bleeding    Routine history and physical examination of adult 04/04/2015   Overview:  Prevnar 2016.  Last Assessment & Plan:  He is agreeable to colonoscopy.   Ordered colonoscopy. Check labs  today.   Sleep apnea    DOES NOT HAVE CPAP-HAD THE 1ST PART OF THE TEST DONE AND DID NOT GO BACK FOR THE 2ND PART    Patient Active Problem List   Diagnosis Date Noted   Recurrent left inguinal hernia 06/23/2017   Chest pain on breathing 03/23/2017   Recurrent inguinal hernia of left side without obstruction or gangrene 12/15/2016   Hypothyroidism, unspecified 12/10/2016   Rectal bleeding 12/10/2016   Peyronie disease 12/10/2016   Cough 02/06/2016   Neuropathy 09/11/2015   Rash 05/02/2015   Asthma 04/04/2015   Hypothyroidism 04/04/2015   Midline low back pain without sciatica 04/04/2015   Routine history and physical examination of adult 04/04/2015   Migraine without aura 04/04/2015   Lipoma of other skin and subcutaneous tissue 05/15/2013    Past Surgical History:  Procedure Laterality Date   hernia Left    HERNIA REPAIR Right    INGUINAL HERNIA REPAIR Left 06/23/2017   Procedure: LAPAROSCOPIC INGUINAL HERNIA;  Surgeon: Olean Ree, MD;  Location: [facility];  Service: General;  Laterality: Left;   INNER EAR SURGERY     MULTIPLE SURGERIES   MYRINGOTOMY WITH TUBE PLACEMENT Bilateral 08/23/2020   Procedure: MYRINGOTOMY WITH TUBE PLACEMENT;  Surgeon: Margaretha Sheffield, MD;  Location: [place];  Service: ENT;  Laterality: Bilateral;  COVID positive 07/25/20   THYROIDECTOMY     TONSILLECTOMY AND ADENOIDECTOMY     VASECTOMY      Prior to Admission medications   Medication Sig Start Date End Date Taking? Authorizing Provider  diphenhydrAMINE (BENADRYL ALLERGY) 25 mg capsule Take 1 capsule (25 mg total) by mouth every 6 (six) hours as needed for up to 5 days. 02/06/21 02/11/21 Yes Vallarie Mare M, PA-C  EPINEPHrine 0.3 mg/0.3 mL IJ SOAJ injection Inject 0.3 mg into the muscle as needed for anaphylaxis. 02/07/21  Yes Vallarie Mare M, PA-C  famotidine (PEPCID) 20 MG tablet Take 1 tablet (20 mg total) by mouth 2 (two) times daily for 5 days. 02/06/21 02/11/21 Yes Vallarie Mare M, PA-C   predniSONE (STERAPRED UNI-PAK 21 TAB) 10 MG (21) TBPK tablet 6,5,4,3,2,1 02/06/21  Yes Sherral Hammers, Raford Brissett M, PA-C  acetaminophen (TYLENOL) 500 MG tablet Take 2 tablets (1,000 mg total) by mouth every 6 (six) hours as needed for mild pain, fever or headache. Patient not taking: No sig reported 06/24/17   Olean Ree, MD  albuterol (PROVENTIL HFA;VENTOLIN HFA) 108 (90 Base) MCG/ACT inhaler Inhale 2 puffs into the lungs every 6 (six) hours as needed for wheezing or shortness of breath.  07/18/16   [provider]  atenolol (TENORMIN) 25 MG tablet Take 25 mg by mouth at bedtime.  08/14/16 11/09/20  [provider]  ibuprofen (ADVIL,MOTRIN) 600 MG tablet Take 1 tablet (600 mg total) by mouth every 8 (eight) hours as needed for headache or moderate pain. 06/24/17   Piscoya, Jacqulyn Bath, MD  levothyroxine (SYNTHROID, LEVOTHROID) 200 MCG tablet Take 200 mcg by mouth at bedtime.  07/18/16   [provider]    Allergies Patient has no known allergies.  Family History  Problem Relation Age of Onset   COPD Father    Cancer Father        Colon   Lung cancer Father    Prostate cancer Father    Hypothyroidism Father    COPD Mother    Hypertension Mother    Hypothyroidism Mother    Brain cancer Sister     Social History Social History   Tobacco Use   Smoking status: Never   Smokeless tobacco: Never  Vaping Use   Vaping Use: Never used  Substance Use Topics   Alcohol use: No   Drug use: No     Review of Systems  Constitutional: No fever/chills Eyes:  No discharge ENT: No upper respiratory complaints. Respiratory: no cough. No SOB/ use of accessory muscles to breath Gastrointestinal:   No nausea, no vomiting.  No diarrhea.  No constipation. Musculoskeletal: Negative for musculoskeletal pain. Skin:Patient has rash.    ____________________________________________   PHYSICAL EXAM:  VITAL SIGNS: ED Triage Vitals  Enc Vitals Group     BP 02/06/21 1633 136/84     Pulse  Rate 02/06/21 1633 70     Resp 02/06/21 1633 16     Temp 02/06/21 1633 98 F (36.7 C)     Temp Source 02/06/21 1633 Oral     SpO2 02/06/21 1633 94 %     Weight 02/06/21 1639 230 lb (104.3 kg)     Height 02/06/21 1639 6' (1.829 m)     Head Circumference --      Peak Flow --      Pain Score 02/06/21 1638 0     Pain Loc --      Pain Edu? --      Excl. in [place]? --  Constitutional: Alert and oriented. Well appearing and in no acute distress. Eyes: Conjunctivae are normal. PERRL. EOMI. Head: Atraumatic. ENT:      Nose: No congestion/rhinnorhea.      Mouth/Throat: Mucous membranes are moist.  Neck: No stridor.  No cervical spine tenderness to palpation. Cardiovascular: Normal rate, regular rhythm. Normal S1 and S2.  Good peripheral circulation. Respiratory: Normal respiratory effort without tachypnea or retractions. Lungs CTAB. Good air entry to the bases with no decreased or absent breath sounds Gastrointestinal: Bowel sounds x 4 quadrants. Soft and nontender to palpation. No guarding or rigidity. No distention. Musculoskeletal: Full range of motion to all extremities. No obvious deformities noted Neurologic:  Normal for age. No gross focal neurologic deficits are appreciated.  Skin:  Skin is warm, dry and intact. No rash noted. Psychiatric: Mood and affect are normal for age. Speech and behavior are normal.   ____________________________________________   LABS (all labs ordered are listed, but only abnormal results are displayed)  Labs Reviewed  CBC WITH DIFFERENTIAL/PLATELET - Abnormal; Notable for the following components:      Result Value   WBC 11.9 (*)    MCHC 37.3 (*)    Neutro Abs 8.6 (*)    All other components within normal limits  COMPREHENSIVE METABOLIC PANEL - Abnormal; Notable for the following components:   Glucose, Bld 128 (*)    Calcium 8.0 (*)    Total Protein 5.0 (*)    Albumin 3.2 (*)    All other components within normal limits    ____________________________________________  EKG   ____________________________________________  RADIOLOGY   No results found.  ____________________________________________    PROCEDURES  Procedure(s) performed:     Procedures     Medications  sodium chloride 0.9 % bolus 1,000 mL (0 mLs Intravenous Stopped 02/06/21 2004)  diphenhydrAMINE (BENADRYL) injection 50 mg (50 mg Intravenous Given 02/06/21 1836)  famotidine (PEPCID) IVPB 20 mg premix (0 mg Intravenous Stopped 02/06/21 2004)  methylPREDNISolone sodium succinate (SOLU-MEDROL) 125 mg/2 mL injection 125 mg (125 mg Intravenous Given 02/06/21 1835)  EPINEPHrine (EPI-PEN) injection 0.3 mg (0.3 mg Intramuscular Given 02/06/21 1830)     ____________________________________________   INITIAL IMPRESSION / ASSESSMENT AND PLAN / ED COURSE  Pertinent labs & imaging results that were available during my care of the patient were reviewed by me and considered in my medical decision making (see chart for details).      Assessment and Plan: Urticaria 60 year old male presents to the emergency department with urticaria of the lower extremities and upper back.  Patient also endorses a sensation of difficulty speaking and throat tightness.  We will treat for anaphylaxis with epi, Solu-Medrol, Pepcid, Benadryl and supplemental fluids and will reassess.  Patient's urticaria resolved after medications were administered the patient was observed in the emergency department for 4 hours.  Patient felt well enough to go home.  I did send patient home with a taper of prednisone, Benadryl and Pepcid to take over the next 5 days.  An EpiPen was also sent to patient's pharmacy.  Return precautions were given to return with new or worsening symptoms.  All patient questions were answered.  ____________________________________________  FINAL CLINICAL IMPRESSION(S) / ED DIAGNOSES  Final diagnoses:  Anaphylaxis, initial encounter       NEW MEDICATIONS STARTED DURING THIS VISIT:  ED Discharge Orders          Ordered    EPINEPHrine 0.3 mg/0.3 mL IJ SOAJ injection  As needed  02/07/21 0004    predniSONE (STERAPRED UNI-PAK 21 TAB) 10 MG (21) TBPK tablet        02/06/21 2213    diphenhydrAMINE (BENADRYL ALLERGY) 25 mg capsule  Every 6 hours PRN        02/06/21 2213    famotidine (PEPCID) 20 MG tablet  2 times daily        02/06/21 2213                This chart was dictated using voice recognition software/Dragon. Despite best efforts to proofread, errors can occur which can change the meaning. Any change was purely unintentional.     Lannie Fields, PA-C 02/07/21 Adelfa Koh    Duffy Bruce, MD 02/08/21 [phone]

## 2021-02-06 NOTE — Discharge Instructions (Addendum)
You can take 25 mg of Benadryl every six hours for rash for the next five days. You can take 40 mg of Pepcid once daily for the next five days.  Take tapered Prednisone as directed.

## 2021-02-06 NOTE — ED Triage Notes (Signed)
Pt to ED for rash to groin, ankles, feet that started this afternoon. Took benadryl PTA.  RR even and unlabored, speaking in complete sentences, NAD noted Denies pain

## 2021-02-07 MED ORDER — EPINEPHRINE 0.3 MG/0.3ML IJ SOAJ: 0.3000 mg | INTRAMUSCULAR | 1 refills | Status: DC | PRN

## 2022-09-19 ENCOUNTER — Telehealth: Payer: Self-pay | Admitting: Neurology

## 2022-09-19 NOTE — Telephone Encounter (Signed)
Caller Name: Jesus Dean Call back phone #: [phone]  Reason for Call:  Jesus Dean called stating that someone had made an appt with Dr Ethelene Hal for a mental diagnosis and to attend anger management classes before his court date. I shared that he did not have an ppt scheduled and told him about behavioral health and gave him the number to call. He seemed fine with that.

## 2022-09-27 ENCOUNTER — Encounter (HOSPITAL_COMMUNITY): Payer: Self-pay | Admitting: Behavioral Health

## 2022-09-27 ENCOUNTER — Ambulatory Visit (HOSPITAL_COMMUNITY)
Admission: EM | Admit: 2022-09-27 | Discharge: 2022-09-27 | Disposition: A | Discharge status: Home / Self Care | Payer: BC Managed Care – PPO | Attending: Behavioral Health | Admitting: Behavioral Health

## 2022-09-27 DIAGNOSIS — Z008 Encounter for other general examination: Secondary | ICD-10-CM

## 2022-09-27 NOTE — ED Notes (Signed)
Patient was discharged to home by provider. Patient was given AVS with community resources.

## 2022-09-27 NOTE — Discharge Instructions (Addendum)
  [facility]: Outpatient psychiatric Services  New Patient Assessment and Therapy Walk-in Monday thru Thursday 8:00 am first come first serve until slots are full Every Friday from 1:00 pm to 4:00 pm first come first serve until slots are full  New Patient Psychiatric Medication Management Monday thru Friday from 8:00 am to 11:00 am first come first served until slots are full  For all walk-ins we ask that you arrive by 7:15 am because patients will be seen in there order of arrival.   Availability is limited, and therefore you may not be seen on the same day that you walk in.  Our goal is to serve and meet the needs of our community to the best of our ability.    
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
## 2023-06-27 NOTE — Discharge Summary (Signed)
 Physician Discharge Summary [facility] OBSERVATION [facility] [address] [place] [address] Dept: [phone] Loc: [phone]   Identifying Information:  Jarae Panas [birth year] [id number]  Primary Care Physician: Amadeo Norleen Gerald, MD  Code Status: Full Code  Admit Date: 06/24/2023  Discharge Date: 06/27/2023   Discharge To: Home  Discharge Service: [facility]   Discharge Attending Physician: Morna Merna Ellen, MD  Discharge Diagnoses: Principal Problem:   Sepsis (CMS-HCC) (POA: Yes) Active Problems:   Hypothyroidism (RAF-HCC) (POA: Yes)   Migraine with aura (POA: Yes)   Chest pain on breathing (POA: Yes)   Hyperlipidemia (POA: Yes) Resolved Problems:   * No resolved hospital problems. *   Outpatient Provider Follow Up Issues:  [  ] consider starting metformin or other oral medication for diabetes  Hospital Course:  Jadarian Mckay is a 62 year old man with T2DM (A1c 7.1 on 06/24/23), HTN, HLD, migraines, hypothyroidism who presented with sepsis due to E coli and Strep mitis UTI.   Sepsis due to E.coli + Strep Mitis UTI Initial presentation with fever, malaise, myalgias, tachycardia, and leukocytosis to 17.9. Also with suprapubic pressure. UCx 12/18 grew 50-100k CFU E coli (R-ampicillin, I-Augmentin, otherwise sensitive) and 10-50k CFU Strep mitis. BCx no growth. No recent dental work (crown placed a few months ago). Clinically improved on ceftriaxone. Limited TTE did not reveal any valvular vegetations. ID consulted and recommended 7 day treatment with Keflex (end date 06/30/23).       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 Symptoms worse with palpation of the precordium and deep breathing. D-dimer and troponin normal. CTA negative for PE and RPP negative. No evidence of volume overload. Improved with treatment of  infection above, likely occurred in setting of sepsis.    Hypothyroidism Continued home Synthroid    Intermittent lower extremity edema Chronic, managed with compression stockings and elevation. Not on lasix at baseline.    Type 2 Diabetes A1c 7.1% on 06/24/23, increased from 6.6 on 08/01/22. Not on any diabetic meds at baseline.  Offered patient metformin on discharge, but he preferred to discuss with PCP at follow-up.   Migraine headaches On diltiazem  for prophylaxis in addition to Imitrex for abortive treatment.  Had migraine on presentation that improved after admission. Restarted his home diltiazem  once stable. Continued as needed Imitrex and provided refill on discharge per patient request.   GERD Continued PPI.   Procedures: None No admission procedures for hospital encounter. ______________________________________________________________________ Discharge Medications:   Your Medication List     START taking these medications    cephalexin 500 MG capsule Commonly known as: KEFLEX Take 1 capsule (500 mg total) by mouth every six (6) hours for 4 days.       CONTINUE taking these medications    albuterol  90 mcg/actuation inhaler Commonly known as: PROVENTIL  HFA;VENTOLIN  HFA Inhale 2 puffs every six (6) hours as needed for wheezing.   ALEVE ORAL Take 1 tablet by mouth daily as needed.   atorvastatin  10 MG tablet Commonly known as: LIPITOR TAKE 1 TABLET DAILY   dilTIAZem  120 MG 24 hr capsule Commonly known as: TIAZAC  TAKE 1 CAPSULE DAILY   levothyroxine  200 MCG tablet Commonly known as: SYNTHROID  TAKE 1 TABLET DAILY   omeprazole 20 MG capsule Commonly known as: PriLOSEC TAKE 1 CAPSULE DAILY   sumatriptan 50 MG tablet Commonly known as: IMITREX Take 1 tablet (  50 mg total) by mouth once as needed for migraine.        Allergies: Patient has no known allergies. ______________________________________________________________________ Pending Test  Results (if blank, then none): Pending Labs     Order Current Status   Blood Culture Preliminary result   Blood Culture Preliminary result   Blood Culture #1 Preliminary result       Most Recent Labs: All lab results last 24 hours -  Recent Results (from the past 24 hours)  POCT Glucose   Collection Time: 06/26/23 11:55 AM  Result Value Ref Range   Glucose, POC 169 70 - 179 mg/dL  POCT Glucose   Collection Time: 06/26/23  5:19 PM  Result Value Ref Range   Glucose, POC 220 (H) 70 - 179 mg/dL  POCT Glucose   Collection Time: 06/26/23 10:18 PM  Result Value Ref Range   Glucose, POC 261 (H) 70 - 179 mg/dL  Basic metabolic panel   Collection Time: 06/27/23  4:11 AM  Result Value Ref Range   Sodium 138 135 - 145 mmol/L   Potassium 3.7 3.4 - 4.8 mmol/L   Chloride 102 98 - 107 mmol/L   CO2 25.0 20.0 - 31.0 mmol/L   Anion Gap 11 5 - 14 mmol/L   BUN 10 9 - 23 mg/dL   Creatinine 9.39 (L) 9.26 - 1.18 mg/dL   BUN/Creatinine Ratio 17    eGFR CKD-EPI (2021) Male >90 >=60 mL/min/1.37m2   Glucose 168 70 - 179 mg/dL   Calcium  9.0 8.7 - 10.4 mg/dL  CBC   Collection Time: 06/27/23  4:11 AM  Result Value Ref Range   WBC 6.8 3.6 - 11.2 10*9/L   RBC 4.70 4.26 - 5.60 10*12/L   HGB 14.5 12.9 - 16.5 g/dL   HCT 59.4 60.9 - 51.9 %   MCV 86.2 77.6 - 95.7 fL   MCH 30.8 25.9 - 32.4 pg   MCHC 35.8 32.0 - 36.0 g/dL   RDW 87.3 87.7 - 84.7 %   MPV 8.5 6.8 - 10.7 fL   Platelet 221 150 - 450 10*9/L  POCT Glucose   Collection Time: 06/27/23  7:00 AM  Result Value Ref Range   Glucose, POC 154 70 - 179 mg/dL   Microbiology -  Microbiology Results (last day)     Procedure Component Value Date/Time Date/Time   Blood Culture [[id number]]  (Normal) Collected: 06/24/23 1912   Lab Status: Preliminary result Specimen: Blood from 1 Peripheral Draw Updated: 06/26/23 2315    Blood Culture, Routine No Growth at 48 hours   Blood Culture [[id number]]  (Normal) Collected: 06/24/23 1911   Lab Status:  Preliminary result Specimen: Blood from 1 Peripheral Draw Updated: 06/26/23 2315    Blood Culture, Routine No Growth at 48 hours   Blood Culture #1 [[id number]]  (Normal) Collected: 06/24/23 2222   Lab Status: Preliminary result Specimen: Blood from 1 Peripheral Draw Updated: 06/26/23 2315    Blood Culture, Routine No Growth at 48 hours   Urine Culture [[id number]]  (Abnormal)  (Susceptibility) Collected: 06/24/23 1350   Lab Status: Final result Specimen: Urine from Clean Catch Updated: 06/26/23 1243    Urine Culture, Comprehensive 50,000 to 100,000 CFU/mL Escherichia coli*     10,000 to 50,000 CFU/mL Streptococcus mitis group*    Comment: Member of the Streptococcus viridans Group     Narrative:     Specimen Source: Clean Catch   Susceptibility     Escherichia coli (1)     Antibiotic  Interpretation Microscan Method Status   Ampicillin Resistant  MIC SUSCEPTIBILITY RESULT Final   Ampicillin + Sulbactam Intermediate  MIC SUSCEPTIBILITY RESULT Final   Cefazolin  Susceptible  MIC SUSCEPTIBILITY RESULT Final   Cephalexin Susceptible  MIC SUSCEPTIBILITY RESULT Final    For uncomplicated UTI's only.      Ciprofloxacin  Susceptible  MIC SUSCEPTIBILITY RESULT Final   Gentamicin Susceptible  MIC SUSCEPTIBILITY RESULT Final   Levofloxacin Susceptible  MIC SUSCEPTIBILITY RESULT Final   Nitrofurantoin Susceptible  MIC SUSCEPTIBILITY RESULT Final   Piperacillin + Tazobactam Susceptible  MIC SUSCEPTIBILITY RESULT Final   Tetracycline Susceptible  MIC SUSCEPTIBILITY RESULT Final    Organisms that test susceptible to tetracycline are considered susceptible to doxycycline.However, some organisms that test intermediate or resistant to tetracycline may be susceptible to doxycycline.      Tobramycin Susceptible  MIC SUSCEPTIBILITY RESULT Final   Trimethoprim + Sulfamethoxazole Susceptible  MIC SUSCEPTIBILITY RESULT Final                   Relevant Studies/Radiology (if blank, then  none): Echocardiogram W Colorflow Spectral Doppler Result Date: 06/26/2023 Patient Info Name:     Brantlee Penn Age:     62 years DOB:     1961-01-19 Gender:     Male MRN:     [id number] Accession #:     [id number] UN Account #:     [id number] Ht:     183 cm Wt:     100 kg BSA:     2.27 m2 BP:     139 /     80 mmHg Exam Date:     06/26/2023 8:48 AM Admit Date:     06/24/2023 Exam Type:     ECHOCARDIOGRAM W COLORFLOW SPECTRAL DOPPLER Technical Quality:     Fair Staff Sonographer:     Burnard Brasil Reading Fellow:     Dorn Kapur Study Info Indications      - evaluate valves in setting of strep mitis UTI Procedure(s)   Complete two-dimensional, color flow and Doppler transthoracic echocardiogram is performed. Summary   1. The left ventricle is normal in size with normal wall thickness.   2. The left ventricular systolic function is normal, LVEF is visually estimated at > 55%.   3. The right ventricle is normal in size, with normal systolic function.   4. No evidence of valvular or endocardial vegetation.   5. Clinical correlation and complementary diagnostic studies (ie. transesophageal echocardiography) are required to confirm or exclude the diagnosis of infective endocarditis. Left Ventricle   The left ventricle is normal in size with normal wall thickness. The left ventricular systolic function is normal, LVEF is visually estimated at > 55%. Left ventricular diastolic function cannot be accurately assessed. Right Ventricle   The right ventricle is normal in size, with normal systolic function. Left Atrium   The left atrium is normal in size. Right Atrium   The right atrium is normal in size. Aortic Valve   The aortic valve is trileaflet with normal appearing leaflets with normal excursion. There is no significant aortic regurgitation. There is no evidence of a significant transvalvular gradient. Mitral Valve   The mitral valve leaflets are normal with normal leaflet mobility. There is no significant  mitral valve regurgitation. Tricuspid Valve   The tricuspid valve leaflets are normal, with normal leaflet mobility. There is mild tricuspid regurgitation. The pulmonary systolic pressure cannot be estimated due to insufficient TR signal. Pulmonic Valve   The pulmonic valve is  poorly visualized, but probably normal. There is no significant pulmonic regurgitation. There is no evidence of a significant transvalvular gradient. Aorta   The aorta is normal in size in the visualized segments. Inferior Vena Cava   The IVC is not well visualized precluding the ability to accurate assess right atrial pressure. Pericardium/Pleural   There is no pericardial effusion. Ventricles ---------------------------------------------------------------------- Name                                 Value        Normal ---------------------------------------------------------------------- LV Dimensions 2D/MM ----------------------------------------------------------------------  IVS Diastolic Thickness (2D)                                0.8 cm       0.6-1.0 LVID Diastole (2D)                  4.7 cm       4.2-5.8  LVPW Diastolic Thickness (2D)                                0.8 cm       0.6-1.0 LVID Systole (2D)                   3.1 cm       2.5-4.0 LV Mass Index (2D Cubed)           54 g/m2        49-115  Relative Wall Thickness (2D)                                  0.34        <=0.42 RV Dimensions 2D/MM ----------------------------------------------------------------------  RV Basal Diastolic Dimension                           3.7 cm       2.5-4.1 TAPSE                               2.4 cm         >=1.7 Atria ---------------------------------------------------------------------- Name                                 Value        Normal ---------------------------------------------------------------------- LA Dimensions ---------------------------------------------------------------------- LA Dimension (2D)                   4.4 cm        3.0-4.1 LA Volume Index (4C A-L)        25.58 ml/m2               LA Volume Index (2C A-L)        24.34 ml/m2               LA Volume (BP MOD)                   58 ml               LA Volume Index (BP MOD)  25.28 ml/m2   16.00-34.00 Aortic Valve ---------------------------------------------------------------------- Name                                 Value        Normal ---------------------------------------------------------------------- AV Doppler ---------------------------------------------------------------------- AV Peak Velocity                   1.5 m/s               AV Peak Gradient                    9 mmHg Mitral Valve ---------------------------------------------------------------------- Name                                 Value        Normal ---------------------------------------------------------------------- MV Diastolic Function ---------------------------------------------------------------------- MV E Peak Velocity                 64 cm/s               MV A Peak Velocity                 60 cm/s               MV E/A                                 1.1               MV Annular TDI ---------------------------------------------------------------------- MV Septal e' Velocity             7.8 cm/s         >=8.0 MV E/e' (Septal)                       8.1               MV Lateral e' Velocity           11.2 cm/s        >=10.0 MV E/e' (Lateral)                      5.7               MV e' Average                     9.5 cm/s               MV E/e' (Average)                      6.9 Pulmonic Valve ---------------------------------------------------------------------- Name                                 Value        Normal ---------------------------------------------------------------------- PV Doppler ---------------------------------------------------------------------- PV Peak Velocity                   1.0 m/s Aorta ---------------------------------------------------------------------- Name                                  Value  Normal ---------------------------------------------------------------------- Ascending Aorta ---------------------------------------------------------------------- Ao Root Diameter (2D)               3.0 cm               Ao Root Diam Index (2D)          1.3 cm/m2 Venous ---------------------------------------------------------------------- Name                                 Value        Normal ---------------------------------------------------------------------- IVC/SVC ---------------------------------------------------------------------- IVC Diameter (Exp 2D)               2.2 cm         <=2.1 Report Signatures Resident Dorn Kapur on 06/26/2023 10:00 AM  ECG 12 Lead Result Date: 06/25/2023 **POOR DATA QUALITY, INTERPRETATION MAY BE ADVERSELY AFFECTED NORMAL SINUS RHYTHM NORMAL ECG WHEN COMPARED WITH ECG OF 11-May-2019 20:06, NO SIGNIFICANT CHANGE WAS FOUND Confirmed by Antonetta Gull ([id number]) on 06/25/2023 10:32:29 AM  CT Abdomen Pelvis W IV Contrast Result Date: 06/25/2023 EXAM: CT ABDOMEN PELVIS W CONTRAST ACCESSION: [id number] UN REPORT DATE: 06/25/2023 3:37 AM CLINICAL INDICATION: 62 years old with UA with UTI, patient ill - appearing, diffuse abdominal and flank TTP  COMPARISON: Same day CTA chest TECHNIQUE: A helical CT scan of the abdomen and pelvis was obtained following IV contrast from the lung bases through the pubic symphysis. Images were reconstructed in the axial plane. Coronal and sagittal reformatted images were also provided for further evaluation. FINDINGS: LOWER CHEST: Please see dedicated CT chest for characterization of findings above the diaphragm. LIVER: Normal liver contour. Hepatic steatosis. No focal liver lesions. BILIARY: Gallbladder is normal in appearance. No biliary ductal dilatation.  SPLEEN: Normal in size and contour. PANCREAS: Normal pancreatic contour.  No focal lesions.  No ductal dilation. ADRENAL GLANDS: Normal appearance  of the adrenal glands. KIDNEYS/URETERS: Symmetric renal enhancement.  No hydronephrosis.  No solid renal mass. Simple renal cysts and hypoattenuating lesions, too small for characterization. BLADDER: Circumferential bladder wall thickening. There is perivesicular stranding, most pronounced along the posterior aspect of the bladder. REPRODUCTIVE ORGANS: Unremarkable. GI TRACT: Stomach is normal in appearance. Duodenum is normal course and caliber. Colonic diverticulosis. Appendix is normal (2:95). PERITONEUM, RETROPERITONEUM AND MESENTERY: No free air.  No ascites.  No fluid collection. LYMPH NODES: No adenopathy by size criteria. VESSELS: Hepatic and portal veins are patent.  Normal caliber aorta.  BONES and SOFT TISSUES: No aggressive osseous lesions.  No focal soft tissue lesions. Mild multilevel degenerative changes spine. 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   Circumferential bladder wall thickening with associated perivesicular stranding. Findings may be seen in setting of acute cystitis. Other chronic or incidental findings, as described within the body of the report.   CTA Chest W Contrast Result Date: 06/25/2023 EXAM: CTA CHEST W CONTRAST ACCESSION: [id number] UN REPORT DATE: 06/25/2023 3:33 AM CLINICAL INDICATION: Pleuritic chest pain. TECHNIQUE: Contiguous axial images were reconstructed through the chest following a single breath hold helical acquisition during the administration of intravenous contrast material. Images were reformatted in the coronal and sagittal planes. MIP slabs were also constructed. COMPARISON: Same day CT abdomen pelvis chest radiograph 04/24/2023 FINDINGS: PULMONARY ARTERIES: No emboli in either lung. Main pulmonary artery is normal in size. HEART AND VASCULATURE: Cardiac chambers are normal in size. No pericardial effusion. Aorta is normal in caliber. LUNGS, AIRWAYS, AND PLEURA:  Dependent atelectasis, otherwise the lungs are clear.  Central airways are patent. No pleural effusion or pneumothorax. MEDIASTINUM AND LYMPH NODES: No enlarged intrathoracic, axillary, or supraclavicular lymph nodes. No mediastinal mass or other abnormality. CHEST WALL AND BONES: No chest wall abnormalities. Mild degenerative changes of the thoracic spine. UPPER ABDOMEN: Reported separately. OTHER: No other findings.   No pulmonary embolism or other acute findings.  CT Head Wo Contrast Result Date: 06/24/2023 EXAM: Computed tomography, head or brain without contrast material. ACCESSION: [id number] UN CLINICAL INDICATION: 62 years old Male with weakness  COMPARISON: MRI brain dated 12/01/2017, CT head dated 11/23/2017. TECHNIQUE: Axial CT images of the head  from skull base to vertex without contrast. FINDINGS: There are scattered mild hypodense foci within the periventricular and deep white matter.  These are nonspecific but commonly associated with small vessel ischemic changes. Slightly prominent lateral ventricles, unchanged since 2019. There is no midline shift. No mass lesion. There is no evidence of acute infarct. Mucous retention cyst within the left maxillary sinus. The sinuses are pneumatized. Sequela of right-sided canal wall down mastoidectomy. No Intracranial hemorrhage or skull fractures.   No acute appearing intracranial abnormality.   XR Chest 2 views Result Date: 06/24/2023 EXAM: XR CHEST 2 VIEWS ACCESSION: [id number] UN REPORT DATE: 06/24/2023 3:54 PM CLINICAL INDICATION: CHEST PAIN  TECHNIQUE: PA and Lateral Chest Radiographs. COMPARISON: Chest radiograph 02/06/2016 FINDINGS: Left basilar lung atelectasis. No pleural effusion or pneumothorax. Unchanged cardiomediastinal silhouette.   Mild left basilar atelectasis.  ______________________________________________________________________ Discharge Instructions:     Follow Up instructions and Outpatient Referrals    Discharge instructions       Other Instructions     Discharge  instructions     You were admitted to the hospital with a severe urinary tract infection (UTI). We found two bacteria that caused it, E.coli, which is common, and Strep mitis, which is not common. We discussed your treatment with the infectious disease specialists, who recommended that you complete a total of 7 days of an antibiotic (including the time you received an IV antibiotic in the hospital), which is 4 more days of pill antibiotics after you leave the hospital (through 06/30/23). The antibiotic you will be taking is called cephalexin (also known as Keflex) and needs to be taken on a schedule every 6 hours. You may need to set a timer/reminder on your phone to remember to take your doses on time. It is important that you take all of your pills until they are all gone, even if you are feeling better, to make sure your infection is completely treated and will not come back.    You can return to work as you feel comfortable doing in the next few days.       Appointments which have been scheduled for you    Jul 15, 2023 1:00 PM (Arrive by 12:45 PM) Physical with Norleen Benard Fanning, MD [facility] INTERNAL MEDICINE [facility] [place] [facility]) [address] [place] [address] [phone]  Arrive 15 minutes prior to appointment.        ______________________________________________________________________ Discharge Day Services: BP 132/88   Pulse 63   Temp 36.5 C (97.7 F) (Oral)   Resp 18   Ht 180.3 cm (5' 11)   Wt (!) 110 kg (242 lb 8.1 oz)   SpO2 95%   BMI 33.82 kg/m  Pt seen on the day of discharge and determined appropriate for discharge.  Condition at Discharge: good  Length  of Discharge: I spent greater than 30 mins in the discharge of this patient.  *Some images could not be shown.

## 2023-10-07 ENCOUNTER — Other Ambulatory Visit: Payer: Self-pay

## 2023-10-07 ENCOUNTER — Ambulatory Visit: Admission: EM | Admit: 2023-10-07 | Discharge: 2023-10-07 | Disposition: A | Discharge status: Home / Self Care

## 2023-10-07 ENCOUNTER — Encounter: Payer: Self-pay | Admitting: Emergency Medicine

## 2023-10-07 DIAGNOSIS — S90852A Superficial foreign body, left foot, initial encounter: Secondary | ICD-10-CM

## 2023-10-07 NOTE — Discharge Instructions (Signed)
 Able to remove the foreign body in the foot which appears to be a small piece of metal  You are updated on your tetanus and will not need  Monitor foot for signs of infection which include redness, swelling or pus, if this occurs please come back to urgent care  Clean over foot once daily with soap and water until wound has closed  May take Tylenol and/or Motrin as needed for pain as well as complete warm soaks to the foot  May follow-up for any concerns regarding healing

## 2023-10-07 NOTE — ED Triage Notes (Signed)
 Patient presents to [facility] for foreign body in the left foot x 2 weeks.  Unsure what it is or where it occurred.  10/10 pain with ambulation.

## 2023-10-07 NOTE — ED Provider Notes (Signed)
 Jesus Dean    CSN: [id number] Arrival date & time: 10/07/23  0845      History   Chief Complaint Chief Complaint  Patient presents with   Foot Pain   Foreign Body in Skin    HPI Jesus Dean is a 63 y.o. male.   Patient presents for evaluation of a foreign body present to the left foot for 2 weeks.  Progressively worsening with bearing weight.  Has attempted to remove himself but unsuccessful.  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  Denies any drainage or fever.  Past Medical History:  Diagnosis Date   Asthma    GERD (gastroesophageal reflux disease)    OCC   History of angina    History of migraine headaches    Hypothyroidism    Hypothyroidism, unspecified 12/10/2016   Migraine headache 12/10/2016   Neuropathy 09/11/2015   Last Assessment & Plan:  Denies any new complaints.     Peyronie disease    Rectal bleeding    Routine history and physical examination of adult 04/04/2015   Overview:  Prevnar 2016.  Last Assessment & Plan:  He is agreeable to colonoscopy.   Ordered colonoscopy. Check labs today.   Sleep apnea    DOES NOT HAVE CPAP-HAD THE 1ST PART OF THE TEST DONE AND DID NOT GO BACK FOR THE 2ND PART    Patient Active Problem List   Diagnosis Date Noted   Recurrent left inguinal hernia 06/23/2017   Chest pain on breathing 03/23/2017   Recurrent inguinal hernia of left side without obstruction or gangrene 12/15/2016   Hypothyroidism, unspecified 12/10/2016   Rectal bleeding 12/10/2016   Peyronie disease 12/10/2016   Cough 02/06/2016   Neuropathy 09/11/2015   Rash 05/02/2015   Asthma 04/04/2015   Hypothyroidism 04/04/2015   Midline low back pain without sciatica 04/04/2015   Encounter for psychological evaluation 04/04/2015   Migraine without aura 04/04/2015   Lipoma of other skin and subcutaneous tissue 05/15/2013    Past Surgical History:  Procedure Laterality Date   hernia Left     HERNIA REPAIR Right    INGUINAL HERNIA REPAIR Left 06/23/2017   Procedure: LAPAROSCOPIC INGUINAL HERNIA;  Surgeon: Henrene Dodge, MD;  Location: [facility];  Service: General;  Laterality: Left;   INNER EAR SURGERY     MULTIPLE SURGERIES   MYRINGOTOMY WITH TUBE PLACEMENT Bilateral 08/23/2020   Procedure: MYRINGOTOMY WITH TUBE PLACEMENT;  Surgeon: Vernie Murders, MD;  Location: [facility];  Service: ENT;  Laterality: Bilateral;  COVID positive 07/25/20   THYROIDECTOMY     TONSILLECTOMY AND ADENOIDECTOMY     VASECTOMY         Home Medications    Prior to Admission medications   Medication Sig Start Date End Date Taking? Authorizing Provider  atorvastatin (LIPITOR) 20 MG tablet Take 20 mg by mouth daily.   Yes [provider]  acetaminophen (TYLENOL) 500 MG tablet Take 2 tablets (1,000 mg total) by mouth every 6 (six) hours as needed for mild pain, fever or headache. Patient not taking: No sig reported 06/24/17   Henrene Dodge, MD  albuterol (PROVENTIL HFA;VENTOLIN HFA) 108 (90 Base) MCG/ACT inhaler Inhale 2 puffs into the lungs every 6 (six) hours as needed for wheezing or shortness of breath.  07/18/16   [provider]  atenolol (TENORMIN) 25 MG tablet Take 25 mg by mouth at bedtime.  08/14/16 11/09/20  [provider]  diphenhydrAMINE (BENADRYL ALLERGY) 25 mg capsule Take 1 capsule (25 mg total) by mouth every 6 (six) hours as needed for up to 5 days. 02/06/21 02/11/21  Orvil Feil, PA-C  EPINEPHrine 0.3 mg/0.3 mL IJ SOAJ injection Inject 0.3 mg into the muscle as needed for anaphylaxis. 02/07/21   Orvil Feil, PA-C  famotidine (PEPCID) 20 MG tablet Take 1 tablet (20 mg total) by mouth 2 (two) times daily for 5 days. 02/06/21 02/11/21  Orvil Feil, PA-C  ibuprofen (ADVIL,MOTRIN) 600 MG tablet Take 1 tablet (600 mg total) by mouth every 8 (eight) hours as needed for headache or moderate pain. 06/24/17   Piscoya, Elita Quick, MD  levothyroxine (SYNTHROID, LEVOTHROID)  200 MCG tablet Take 200 mcg by mouth at bedtime.  07/18/16   [provider]  predniSONE (STERAPRED UNI-PAK 21 TAB) 10 MG (21) TBPK tablet 6,5,4,3,2,1 02/06/21   Orvil Feil, PA-C    Family History Family History  Problem Relation Age of Onset   COPD Father    Cancer Father        Colon   Lung cancer Father    Prostate cancer Father    Hypothyroidism Father    COPD Mother    Hypertension Mother    Hypothyroidism Mother    Brain cancer Sister     Social History Social History   Tobacco Use   Smoking status: Never   Smokeless tobacco: Never  Vaping Use   Vaping status: Never Used  Substance Use Topics   Alcohol use: No   Drug use: No     Allergies   Patient has no known allergies.   Review of Systems Review of Systems   Physical Exam Triage Vital Signs ED Triage Vitals [10/07/23 0856]  Encounter Vitals Group     BP (!) 152/91     Systolic BP Percentile      Diastolic BP Percentile      Pulse Rate (!) 56     Resp 18     Temp 98.3 F (36.8 C)     Temp Source Temporal     SpO2 96 %     Weight      Height      Head Circumference      Peak Flow      Pain Score 10     Pain Loc      Pain Education      Exclude from Growth Chart    No data found.  Updated Vital Signs BP (!) 152/91 (BP Location: Left Arm)   Pulse (!) 56   Temp 98.3 F (36.8 C) (Temporal)   Resp 18   SpO2 96%   Visual Acuity Right Eye Distance:   Left Eye Distance:   Bilateral Distance:    Right Eye Near:   Left Eye Near:    Bilateral Near:     Physical Exam Constitutional:      Appearance: Normal appearance.  Eyes:     Extraocular Movements: Extraocular movements intact.  Pulmonary:     Effort: Pulmonary effort is normal.  Musculoskeletal:       Feet:  Feet:     Comments: Less than 0.5 cm black dot present to the aspect of the left foot, marked above, no erythema, swelling, tender to palpation Neurological:     Mental Status: He is alert and oriented to  person, place, and time. Mental status is at baseline.      UC Treatments /  Results  Labs (all labs ordered are listed, but only abnormal results are displayed) Labs Reviewed - No data to display  EKG   Radiology No results found.  Procedures Foreign Body Removal  Date/Time: 10/07/2023 9:13 AM  Performed by: Valinda Hoar, NP Authorized by: Valinda Hoar, NP   Consent:    Consent obtained:  Verbal   Consent given by:  Patient   Risks, benefits, and alternatives were discussed: yes     Risks discussed:  Incomplete removal, infection and pain Universal protocol:    Procedure explained and questions answered to patient or proxy's satisfaction: yes     Patient identity confirmed:  Verbally with patient Location:    Location:  Foot   Foot location: left foot.   Tendon involvement:  None Anesthesia:    Anesthesia method:  Topical application   Topical anesthesia: lidocaine mist. Procedure type:    Procedure complexity:  Simple Procedure details:    Incision length:  0.5   Localization method:  Visualized   Foreign bodies recovered:  1   Description:  Black shiny metal   Intact foreign body removal: yes   Post-procedure details:    Neurovascular status: intact     Confirmation:  No additional foreign bodies on visualization   Skin closure:  None   Dressing:  Non-adherent dressing   Procedure completion:  Tolerated  (including critical care time)  Medications Ordered in UC Medications - No data to display  Initial Impression / Assessment and Plan / UC Course  I have reviewed the triage vital signs and the nursing notes.  Pertinent labs & imaging results that were available during my care of the patient were reviewed by me and considered in my medical decision making (see chart for details).  Foreign body in left foot, initial encounter  Able to remove foreign body, appears to be metal, updated on tetanus, last occurrence 2017, advised to monitor for  infection, advised to use over-the-counter analgesics ,advised to follow-up as needed Final Clinical Impressions(s) / UC Diagnoses   Final diagnoses:  Foreign body in left foot, initial encounter     Discharge Instructions      Able to remove the foreign body in the foot which appears to be a small piece of metal  You are updated on your tetanus and will not need  Monitor foot for signs of infection which include redness, swelling or pus, if this occurs please come back to urgent care  Clean over foot once daily with soap and water until wound has closed  May take Tylenol and/or Motrin as needed for pain as well as complete warm soaks to the foot  May follow-up for any concerns regarding healing   ED Prescriptions   None    PDMP not reviewed this encounter.   Valinda Hoar, [place] 10/07/23 [phone]

## 2024-01-25 ENCOUNTER — Emergency Department

## 2024-01-25 ENCOUNTER — Inpatient Hospital Stay
Admission: EM | Admit: 2024-01-25 | Discharge: 2024-01-28 | DRG: 175 | Disposition: A | Discharge status: Home / Self Care | Attending: Internal Medicine | Admitting: Internal Medicine

## 2024-01-25 ENCOUNTER — Telehealth (HOSPITAL_COMMUNITY): Payer: Self-pay | Admitting: Pharmacy Technician

## 2024-01-25 ENCOUNTER — Inpatient Hospital Stay
Admit: 2024-01-25 | Discharge: 2024-01-25 | Disposition: A | Discharge status: Home / Self Care | Attending: Internal Medicine

## 2024-01-25 ENCOUNTER — Inpatient Hospital Stay

## 2024-01-25 ENCOUNTER — Other Ambulatory Visit: Payer: Self-pay

## 2024-01-25 ENCOUNTER — Other Ambulatory Visit (HOSPITAL_COMMUNITY): Payer: Self-pay

## 2024-01-25 DIAGNOSIS — J45909 Unspecified asthma, uncomplicated: Secondary | ICD-10-CM | POA: Diagnosis present

## 2024-01-25 DIAGNOSIS — E669 Obesity, unspecified: Secondary | ICD-10-CM | POA: Diagnosis present

## 2024-01-25 DIAGNOSIS — D72829 Elevated white blood cell count, unspecified: Secondary | ICD-10-CM | POA: Diagnosis present

## 2024-01-25 DIAGNOSIS — Z8701 Personal history of pneumonia (recurrent): Secondary | ICD-10-CM

## 2024-01-25 DIAGNOSIS — I2489 Other forms of acute ischemic heart disease: Secondary | ICD-10-CM | POA: Diagnosis present

## 2024-01-25 DIAGNOSIS — G4730 Sleep apnea, unspecified: Secondary | ICD-10-CM | POA: Diagnosis present

## 2024-01-25 DIAGNOSIS — I82441 Acute embolism and thrombosis of right tibial vein: Secondary | ICD-10-CM | POA: Diagnosis present

## 2024-01-25 DIAGNOSIS — Z8042 Family history of malignant neoplasm of prostate: Secondary | ICD-10-CM | POA: Diagnosis not present

## 2024-01-25 DIAGNOSIS — Z825 Family history of asthma and other chronic lower respiratory diseases: Secondary | ICD-10-CM

## 2024-01-25 DIAGNOSIS — J9601 Acute respiratory failure with hypoxia: Secondary | ICD-10-CM | POA: Diagnosis present

## 2024-01-25 DIAGNOSIS — R071 Chest pain on breathing: Secondary | ICD-10-CM | POA: Diagnosis present

## 2024-01-25 DIAGNOSIS — E039 Hypothyroidism, unspecified: Secondary | ICD-10-CM | POA: Diagnosis not present

## 2024-01-25 DIAGNOSIS — Z801 Family history of malignant neoplasm of trachea, bronchus and lung: Secondary | ICD-10-CM

## 2024-01-25 DIAGNOSIS — Z8249 Family history of ischemic heart disease and other diseases of the circulatory system: Secondary | ICD-10-CM

## 2024-01-25 DIAGNOSIS — Z79899 Other long term (current) drug therapy: Secondary | ICD-10-CM | POA: Diagnosis not present

## 2024-01-25 DIAGNOSIS — I2609 Other pulmonary embolism with acute cor pulmonale: Secondary | ICD-10-CM | POA: Diagnosis not present

## 2024-01-25 DIAGNOSIS — I2699 Other pulmonary embolism without acute cor pulmonale: Principal | ICD-10-CM | POA: Diagnosis present

## 2024-01-25 DIAGNOSIS — E890 Postprocedural hypothyroidism: Secondary | ICD-10-CM | POA: Diagnosis present

## 2024-01-25 DIAGNOSIS — Z79890 Hormone replacement therapy (postmenopausal): Secondary | ICD-10-CM | POA: Diagnosis not present

## 2024-01-25 DIAGNOSIS — Z808 Family history of malignant neoplasm of other organs or systems: Secondary | ICD-10-CM | POA: Diagnosis not present

## 2024-01-25 DIAGNOSIS — G43909 Migraine, unspecified, not intractable, without status migrainosus: Secondary | ICD-10-CM | POA: Diagnosis present

## 2024-01-25 DIAGNOSIS — K219 Gastro-esophageal reflux disease without esophagitis: Secondary | ICD-10-CM | POA: Diagnosis present

## 2024-01-25 DIAGNOSIS — Z7901 Long term (current) use of anticoagulants: Secondary | ICD-10-CM | POA: Diagnosis not present

## 2024-01-25 DIAGNOSIS — I10 Essential (primary) hypertension: Secondary | ICD-10-CM | POA: Diagnosis present

## 2024-01-25 DIAGNOSIS — Z6832 Body mass index (BMI) 32.0-32.9, adult: Secondary | ICD-10-CM

## 2024-01-25 DIAGNOSIS — Z5902 Unsheltered homelessness: Secondary | ICD-10-CM | POA: Diagnosis not present

## 2024-01-25 DIAGNOSIS — R0602 Shortness of breath: Secondary | ICD-10-CM | POA: Diagnosis present

## 2024-01-25 LAB — BASIC METABOLIC PANEL WITH GFR
Anion gap: 11 (ref 5–15)
BUN: 20 mg/dL (ref 8–23)
CO2: 26 mmol/L (ref 22–32)
Calcium: 8.9 mg/dL (ref 8.9–10.3)
Chloride: 102 mmol/L (ref 98–111)
Creatinine, Ser: 0.76 mg/dL (ref 0.61–1.24)
GFR, Estimated: >60 mL/min (ref >60)
Glucose, Bld: 191 mg/dL — ABNORMAL HIGH (ref 70–99)
Potassium: 4.1 mmol/L (ref 3.5–5.1)
Sodium: 139 mmol/L (ref 135–145)

## 2024-01-25 LAB — ECHOCARDIOGRAM COMPLETE
AR max vel: 2.77 cm2
AV Area VTI: 2.79 cm2
AV Area mean vel: 2.47 cm2
AV Mean grad: 3 mmHg
AV Peak grad: 6.5 mmHg
Ao pk vel: 1.27 m/s
Area-P 1/2: 2.96 cm2
Height: 72 in
MV VTI: 3.22 cm2
S' Lateral: 3.8 cm
Weight: 3840 [oz_av]

## 2024-01-25 LAB — RESP PANEL BY RT-PCR (RSV, FLU A&B, COVID)  RVPGX2
Influenza A by PCR: NEGATIVE
Influenza B by PCR: NEGATIVE
Resp Syncytial Virus by PCR: NEGATIVE
SARS Coronavirus 2 by RT PCR: NEGATIVE

## 2024-01-25 LAB — LACTIC ACID, PLASMA: Lactic Acid, Venous: 1.6 mmol/L (ref 0.5–1.9)

## 2024-01-25 LAB — CBC WITH DIFFERENTIAL/PLATELET
Abs Immature Granulocytes: 0.02 K/uL (ref 0.00–0.07)
Basophils Absolute: 0.1 K/uL (ref 0.0–0.1)
Basophils Relative: 1 %
Eosinophils Absolute: 0.3 K/uL (ref 0.0–0.5)
Eosinophils Relative: 3 %
HCT: 40.7 % (ref 39.0–52.0)
Hemoglobin: 14.3 g/dL (ref 13.0–17.0)
Immature Granulocytes: 0 %
Lymphocytes Relative: 15 %
Lymphs Abs: 1.6 K/uL (ref 0.7–4.0)
MCH: 30.2 pg (ref 26.0–34.0)
MCHC: 35.1 g/dL (ref 30.0–36.0)
MCV: 86 fL (ref 80.0–100.0)
Monocytes Absolute: 0.9 K/uL (ref 0.1–1.0)
Monocytes Relative: 8 %
Neutro Abs: 7.9 K/uL — ABNORMAL HIGH (ref 1.7–7.7)
Neutrophils Relative %: 73 %
Platelets: 221 K/uL (ref 150–400)
RBC: 4.73 MIL/uL (ref 4.22–5.81)
RDW: 12.9 % (ref 11.5–15.5)
WBC: 10.8 K/uL — ABNORMAL HIGH (ref 4.0–10.5)
nRBC: 0 % (ref 0.0–0.2)

## 2024-01-25 LAB — PROCALCITONIN: Procalcitonin: <0.1 ng/mL

## 2024-01-25 LAB — TROPONIN I (HIGH SENSITIVITY)
Troponin I (High Sensitivity): 52 ng/L — ABNORMAL HIGH (ref <18)
Troponin I (High Sensitivity): 49 ng/L — ABNORMAL HIGH (ref <18)

## 2024-01-25 LAB — HEPARIN LEVEL (UNFRACTIONATED)
Heparin Unfractionated: 0.22 [IU]/mL — ABNORMAL LOW (ref 0.30–0.70)
Heparin Unfractionated: 0.45 [IU]/mL (ref 0.30–0.70)

## 2024-01-25 LAB — HIV ANTIBODY (ROUTINE TESTING W REFLEX): HIV Screen 4th Generation wRfx: NONREACTIVE

## 2024-01-25 MED ORDER — DILTIAZEM HCL ER COATED BEADS 120 MG PO CP24
120.0000 mg | ORAL_CAPSULE | Freq: Every day | ORAL | Status: DC
  Administered 2024-01-25 – 2024-01-26 (×2): 120 mg via ORAL
  Filled 2024-01-25 (×2): qty 1

## 2024-01-25 MED ORDER — ATENOLOL 25 MG PO TABS: 25.0000 mg | ORAL_TABLET | Freq: Every day | ORAL | Status: DC

## 2024-01-25 MED ORDER — IPRATROPIUM-ALBUTEROL 0.5-2.5 (3) MG/3ML IN SOLN
3.0000 mL | Freq: Once | RESPIRATORY_TRACT | Status: AC
  Administered 2024-01-25: 3 mL via RESPIRATORY_TRACT
  Filled 2024-01-25: qty 3

## 2024-01-25 MED ORDER — TRAZODONE HCL 50 MG PO TABS
25.0000 mg | ORAL_TABLET | Freq: Every evening | ORAL | Status: DC | PRN
  Filled 2024-01-25: qty 1

## 2024-01-25 MED ORDER — MAGNESIUM HYDROXIDE 400 MG/5ML PO SUSP: 30.0000 mL | Freq: Every day | ORAL | Status: DC | PRN

## 2024-01-25 MED ORDER — ONDANSETRON HCL 4 MG PO TABS: 4.0000 mg | ORAL_TABLET | Freq: Four times a day (QID) | ORAL | Status: DC | PRN

## 2024-01-25 MED ORDER — ASPIRIN 81 MG PO TBEC
81.0000 mg | DELAYED_RELEASE_TABLET | Freq: Every day | ORAL | Status: DC
  Administered 2024-01-26 – 2024-01-28 (×3): 81 mg via ORAL
  Filled 2024-01-25 (×3): qty 1

## 2024-01-25 MED ORDER — ACETAMINOPHEN 325 MG PO TABS: 650.0000 mg | ORAL_TABLET | Freq: Four times a day (QID) | ORAL | Status: DC | PRN

## 2024-01-25 MED ORDER — ACETAMINOPHEN 650 MG RE SUPP: 650.0000 mg | Freq: Four times a day (QID) | RECTAL | Status: DC | PRN

## 2024-01-25 MED ORDER — FAMOTIDINE 20 MG PO TABS
20.0000 mg | ORAL_TABLET | Freq: Two times a day (BID) | ORAL | Status: DC
  Administered 2024-01-25: 20 mg via ORAL
  Filled 2024-01-25: qty 1

## 2024-01-25 MED ORDER — LEVOTHYROXINE SODIUM 100 MCG PO TABS
200.0000 ug | ORAL_TABLET | Freq: Every day | ORAL | Status: DC
  Administered 2024-01-25 – 2024-01-27 (×3): 200 ug via ORAL
  Filled 2024-01-25 (×3): qty 2

## 2024-01-25 MED ORDER — ATORVASTATIN CALCIUM 20 MG PO TABS: 10.0000 mg | ORAL_TABLET | Freq: Every day | ORAL | Status: DC

## 2024-01-25 MED ORDER — IOHEXOL 350 MG/ML SOLN
100.0000 mL | Freq: Once | INTRAVENOUS | Status: AC | PRN
  Administered 2024-01-25: 100 mL via INTRAVENOUS

## 2024-01-25 MED ORDER — HEPARIN (PORCINE) 25000 UT/250ML-% IV SOLN
1800.0000 [IU]/h | INTRAVENOUS | Status: DC
  Administered 2024-01-25: 1800 [IU]/h via INTRAVENOUS
  Administered 2024-01-25: 1600 [IU]/h via INTRAVENOUS
  Administered 2024-01-26: 1800 [IU]/h via INTRAVENOUS
  Filled 2024-01-25 (×3): qty 250

## 2024-01-25 MED ORDER — LEVOTHYROXINE SODIUM 100 MCG PO TABS: 200.0000 ug | ORAL_TABLET | Freq: Every day | ORAL | Status: DC

## 2024-01-25 MED ORDER — SODIUM CHLORIDE 0.9 % IV SOLN: INTRAVENOUS | Status: AC

## 2024-01-25 MED ORDER — ORAL CARE MOUTH RINSE: 15.0000 mL | OROMUCOSAL | Status: DC | PRN

## 2024-01-25 MED ORDER — ATORVASTATIN CALCIUM 10 MG PO TABS
10.0000 mg | ORAL_TABLET | Freq: Every day | ORAL | Status: DC
  Administered 2024-01-25 – 2024-01-27 (×3): 10 mg via ORAL
  Filled 2024-01-25 (×3): qty 1

## 2024-01-25 MED ORDER — HEPARIN BOLUS VIA INFUSION
1500.0000 [IU] | Freq: Once | INTRAVENOUS | Status: AC
  Administered 2024-01-25: 1500 [IU] via INTRAVENOUS
  Filled 2024-01-25: qty 1500

## 2024-01-25 MED ORDER — ONDANSETRON HCL 4 MG/2ML IJ SOLN: 4.0000 mg | Freq: Four times a day (QID) | INTRAMUSCULAR | Status: DC | PRN

## 2024-01-25 MED ORDER — DIPHENHYDRAMINE HCL 25 MG PO CAPS: 25.0000 mg | ORAL_CAPSULE | Freq: Four times a day (QID) | ORAL | Status: DC | PRN

## 2024-01-25 MED ORDER — METHYLPREDNISOLONE SODIUM SUCC 125 MG IJ SOLR
125.0000 mg | Freq: Once | INTRAMUSCULAR | Status: AC
  Administered 2024-01-25: 125 mg via INTRAVENOUS
  Filled 2024-01-25: qty 2

## 2024-01-25 MED ORDER — FAMOTIDINE 20 MG PO TABS
20.0000 mg | ORAL_TABLET | Freq: Two times a day (BID) | ORAL | Status: DC
Start: 2024-01-25 — End: 2024-01-28
  Administered 2024-01-25 – 2024-01-28 (×6): 20 mg via ORAL
  Filled 2024-01-25 (×6): qty 1

## 2024-01-25 MED ORDER — ALBUTEROL SULFATE (2.5 MG/3ML) 0.083% IN NEBU: 3.0000 mL | INHALATION_SOLUTION | Freq: Four times a day (QID) | RESPIRATORY_TRACT | Status: DC | PRN

## 2024-01-25 MED ORDER — HEPARIN BOLUS VIA INFUSION
5000.0000 [IU] | Freq: Once | INTRAVENOUS | Status: AC
  Administered 2024-01-25: 5000 [IU] via INTRAVENOUS
  Filled 2024-01-25: qty 5000

## 2024-01-25 MED ORDER — ASPIRIN 81 MG PO CHEW
324.0000 mg | CHEWABLE_TABLET | Freq: Once | ORAL | Status: AC
  Administered 2024-01-25: 324 mg via ORAL
  Filled 2024-01-25: qty 4

## 2024-01-25 NOTE — ED Triage Notes (Signed)
 Patient arrives from home via EMS. Patient complains of shortness of breath and chest pain when taking a deep breath. Patient was diagnosed with pneumonia and given 5 days aof antibiotics. Patient reports the last 2 days he has has increased shortness of breath and been using his inhaler more frequently.

## 2024-01-25 NOTE — Plan of Care (Signed)

## 2024-01-25 NOTE — Progress Notes (Signed)
 PHARMACY - ANTICOAGULATION CONSULT NOTE  Pharmacy Consult for heparin  dosage adjustment Indication: pulmonary embolus  No Known Allergies  Patient Measurements: Height: 6' (182.9 cm) Weight: 108.9 kg (240 lb) IBW/kg (Calculated) : 77.6 HEPARIN  DW (KG): 100.6  Vital Signs: Temp: 97.7 F (36.5 C) (07/21 0735) Temp Source: Oral (07/21 0039) BP: 120/80 (07/21 0735) Pulse Rate: 69 (07/21 0735)  Labs: Recent Labs    01/25/24 0104 01/25/24 0214 01/25/24 0910  HGB 14.3  --   --   HCT 40.7  --   --   PLT 221  --   --   HEPARINUNFRC  --   --  0.22*  CREATININE 0.76  --   --   TROPONINIHS 52* 49*  --     Estimated Creatinine Clearance: 123.6 mL/min (by C-G formula based on SCr of 0.76 mg/dL).   Medical History: Past Medical History:  Diagnosis Date   Asthma    GERD (gastroesophageal reflux disease)    OCC   History of angina    History of migraine headaches    Hypothyroidism    Hypothyroidism, unspecified 12/10/2016   Migraine headache 12/10/2016   Neuropathy 09/11/2015   Last Assessment & Plan:  Denies any new complaints.     Peyronie disease    Rectal bleeding    Routine history and physical examination of adult 04/04/2015   Overview:  Prevnar 2016.  Last Assessment & Plan:  He is agreeable to colonoscopy.   Ordered colonoscopy. Check labs today.   Sleep apnea    DOES NOT HAVE CPAP-HAD THE 1ST PART OF THE TEST DONE AND DID NOT GO BACK FOR THE 2ND PART    Medications:  Scheduled:   aspirin  EC  81 mg Oral Daily   atorvastatin   20 mg Oral Daily   famotidine   20 mg Oral BID   levothyroxine   200 mcg Oral QHS   Infusions:   sodium chloride      heparin  1,600 Units/hr (01/25/24 0412)   PRN: acetaminophen  **OR** acetaminophen , albuterol , diphenhydrAMINE , magnesium  hydroxide, ondansetron  **OR** ondansetron  (ZOFRAN ) IV, mouth rinse, traZODone   Assessment: 63 year old male presenting with bilateral PE being managed with heparin  infusion. Notable PMH hypothyroidism, asthma,  migraine without aura. Renal function good with Scr 0.76 mg/dL (unknown baseline) and CrCl of 123.6 mL/min. CBC stable with hemglobin of 14.3 g/dL and platelet count 778 K/uL. Heparin  dosing weight of 100.6 kg adjusted for obesity.   Today's heparin  level of 0.22 is below goal of 0.3 - 0.7. Previously received 5,000 unit bolus at 0200 7/21 followed by 1,600 unit/hr infusion.    Goal of Therapy:  Heparin  level 0.3-0.7 units/ml Monitor platelets by anticoagulation protocol: Yes   Plan:  Give 1,500 units bolus x 1, and increase heparin  infusion to 1,800 units/hr. Recheck heparin  level in 6 hours at 1600 with goal level of 0.3 - 0.7. Monitor CBC daily for hemoglobin and platelet count.   Jesus Dean - PharmD Candidate 01/25/2024,10:36 AM

## 2024-01-25 NOTE — Telephone Encounter (Signed)
 Pharmacy Patient Advocate Encounter  Insurance verification completed.    The patient is insured through [employer]. Patient has Medicare and is not eligible for a copay card, but may be able to apply for patient assistance or Medicare RX Payment Plan (Patient Must reach out to their plan, if eligible for payment plan), if available.    Ran test claim for Eliquis  5mg  tablets and the current 30 day co-pay is $50.00.  Ran test claim for Xarelto 10mg  tablets and the current 30 day co-pay is $50.00.   This test claim was processed through [facility]- copay amounts may vary at other pharmacies due to pharmacy/plan contracts, or as the patient moves through the different stages of their insurance plan.

## 2024-01-25 NOTE — Progress Notes (Signed)
*  PRELIMINARY RESULTS* Echocardiogram 2D Echocardiogram has been performed.  Floydene Harder 01/25/2024, 11:11 AM

## 2024-01-25 NOTE — ED Notes (Signed)
 Patient continues to breathe through his mouth while asleep. Placed on high flow nasal cannula due to oxygen saturation decreasing while asleep.

## 2024-01-25 NOTE — H&P (Signed)
 History and Physical    Jesus Dean FMW:[id number] DOB: [birth year] DOA: 01/25/2024  PCP: Kristie Norleen BIRCH, MD (Confirm with patient/family/NH records and if not entered, this has to be entered at [facility] point of entry) Patient coming from: Home  I have personally briefly reviewed patient's old medical records in [facility]  Chief Complaint: Chest pain, SOB  HPI: Jesus Dean is a 63 y.o. male with medical history significant of HTN, hypothyroidism, migraine headache, presented with worsening of pleuritic chest pain.  Symptoms started 3 weeks ago, patient started to develop pleuritic chest pain mainly on the left lower chest, worsening with deep breaths and cough.  Increasingly he has been experienced increasing exertional dyspnea.  He went to a local hospital at [facility], when he was diagnosed with pneumonia and completed p.o. antibiotic for 5 days.  Afterwards however patient continued to experience pleuritic chest pain and he also noticed increasing of lower extremity swelling.  He was told by his doctor to use compression stocking.  Over the weekend, chest pain and shortness of breath have become worse.    ED Course: Afebrile, nontachycardic blood pressure 110/70, O2 saturation 93% on 6 L.  CTA positive for bilateral PE with mild right heart strain.  DVT study positive for right tibial nonocclusive DVT.  Blood work showed hemoglobin 14.3 BUN 20 creatinine 0.7 bicarb 26.  Troponin 52> 49.  Patient was started on heparin  drip in the ED.  Review of Systems: As per HPI otherwise 14 point review of systems negative.    Past Medical History:  Diagnosis Date   Asthma    GERD (gastroesophageal reflux disease)    OCC   History of angina    History of migraine headaches    Hypothyroidism    Hypothyroidism, unspecified 12/10/2016   Migraine headache 12/10/2016   Neuropathy 09/11/2015   Last Assessment & Plan:  Denies any new complaints.     Peyronie disease    Rectal  bleeding    Routine history and physical examination of adult 04/04/2015   Overview:  Prevnar 2016.  Last Assessment & Plan:  He is agreeable to colonoscopy.   Ordered colonoscopy. Check labs today.   Sleep apnea    DOES NOT HAVE CPAP-HAD THE 1ST PART OF THE TEST DONE AND DID NOT GO BACK FOR THE 2ND PART    Past Surgical History:  Procedure Laterality Date   hernia Left    HERNIA REPAIR Right    INGUINAL HERNIA REPAIR Left 06/23/2017   Procedure: LAPAROSCOPIC INGUINAL HERNIA;  Surgeon: Desiderio Schanz, MD;  Location: [facility] ORS;  Service: General;  Laterality: Left;   INNER EAR SURGERY     MULTIPLE SURGERIES   MYRINGOTOMY WITH TUBE PLACEMENT Bilateral 08/23/2020   Procedure: MYRINGOTOMY WITH TUBE PLACEMENT;  Surgeon: Juengel, Paul, MD;  Location: [facility];  Service: ENT;  Laterality: Bilateral;  COVID positive 07/25/20   THYROIDECTOMY     TONSILLECTOMY AND ADENOIDECTOMY     VASECTOMY       reports that he has never smoked. He has never used smokeless tobacco. He reports that he does not drink alcohol and does not use drugs.  No Known Allergies  Family History  Problem Relation Age of Onset   COPD Father    Cancer Father        Colon   Lung cancer Father    Prostate cancer Father    Hypothyroidism Father    COPD Mother    Hypertension  Mother    Hypothyroidism Mother    Brain cancer Sister      Prior to Admission medications   Medication Sig Start Date End Date Taking? Authorizing Provider  atorvastatin  (LIPITOR) 20 MG tablet Take 20 mg by mouth daily.   Yes [provider]  famotidine  (PEPCID ) 20 MG tablet Take 1 tablet (20 mg total) by mouth 2 (two) times daily for 5 days. 02/06/21 01/25/24 Yes Woods, Jaclyn M, PA-C  levothyroxine  (SYNTHROID , LEVOTHROID) 200 MCG tablet Take 200 mcg by mouth at bedtime.  07/18/16  Yes [provider]  acetaminophen  (TYLENOL ) 500 MG tablet Take 2 tablets (1,000 mg total) by mouth every 6 (six) hours as needed for mild  pain, fever or headache. Patient not taking: No sig reported 06/24/17   Desiderio Schanz, MD  albuterol  (PROVENTIL  HFA;VENTOLIN  HFA) 108 (90 Base) MCG/ACT inhaler Inhale 2 puffs into the lungs every 6 (six) hours as needed for wheezing or shortness of breath.  07/18/16   [provider]  atenolol  (TENORMIN ) 25 MG tablet Take 25 mg by mouth at bedtime.  08/14/16 01/25/24  [provider]  diphenhydrAMINE  (BENADRYL  ALLERGY) 25 mg capsule Take 1 capsule (25 mg total) by mouth every 6 (six) hours as needed for up to 5 days. 02/06/21 02/11/21  Woods, Jaclyn M, PA-C  EPINEPHrine  0.3 mg/0.3 mL IJ SOAJ injection Inject 0.3 mg into the muscle as needed for anaphylaxis. 02/07/21   Woods, Jaclyn M, PA-C  ibuprofen  (ADVIL ,MOTRIN ) 600 MG tablet Take 1 tablet (600 mg total) by mouth every 8 (eight) hours as needed for headache or moderate pain. 06/24/17   Desiderio Schanz, MD  predniSONE  [facility] UNI-PAK 21 TAB) 10 MG (21) TBPK tablet 6,5,4,3,2,1 02/06/21   Woods, Jaclyn M, [place]    Physical Exam: Vitals:   01/25/24 0300 01/25/24 0355 01/25/24 0410 01/25/24 0735  BP: 116/78 112/73  120/80  Pulse: 76 73  69  Resp: 18 (!) 22  14  Temp:  97.7 F (36.5 C)  97.7 F (36.5 C)  TempSrc:      SpO2:  92% 94% 91%  Weight:      Height:        Constitutional: NAD, calm, comfortable Vitals:   01/25/24 0300 01/25/24 0355 01/25/24 0410 01/25/24 0735  BP: 116/78 112/73  120/80  Pulse: 76 73  69  Resp: 18 (!) 22  14  Temp:  97.7 F (36.5 C)  97.7 F (36.5 C)  TempSrc:      SpO2:  92% 94% 91%  Weight:      Height:       Eyes: PERRL, lids and conjunctivae normal ENMT: Mucous membranes are moist. Posterior pharynx clear of any exudate or lesions.Normal dentition.  Neck: normal, supple, no masses, no thyromegaly Respiratory: clear to auscultation bilaterally, no wheezing, no crackles. Normal respiratory effort. No accessory muscle use.  Cardiovascular: Regular rate and rhythm, no murmurs / rubs / gallops. 2+  extremity edema right more than left. 2+ pedal pulses. No carotid bruits.  Abdomen: no tenderness, no masses palpated. No hepatosplenomegaly. Bowel sounds positive.  Musculoskeletal: no clubbing / cyanosis. No joint deformity upper and lower extremities. Good ROM, no contractures. Normal muscle tone.  Skin: no rashes, lesions, ulcers. No induration Neurologic: CN 2-12 grossly intact. Sensation intact, DTR normal. Strength 5/5 in all 4.  Psychiatric: Normal judgment and insight. Alert and oriented x 3. Normal mood.     Labs on Admission: I have personally reviewed following labs and imaging studies  CBC: Recent Labs  Lab 01/25/24 0104  WBC 10.8*  NEUTROABS 7.9*  HGB 14.3  HCT 40.7  MCV 86.0  PLT 221   Basic Metabolic Panel: Recent Labs  Lab 01/25/24 0104  NA 139  K 4.1  CL 102  CO2 26  GLUCOSE 191*  BUN 20  CREATININE 0.76  CALCIUM  8.9   GFR: Estimated Creatinine Clearance: 123.6 mL/min (by C-G formula based on SCr of 0.76 mg/dL). Liver Function Tests: No results for input(s): AST, ALT, ALKPHOS, BILITOT, PROT, ALBUMIN in the last 168 hours. No results for input(s): LIPASE, AMYLASE in the last 168 hours. No results for input(s): AMMONIA in the last 168 hours. Coagulation Profile: No results for input(s): INR, PROTIME in the last 168 hours. Cardiac Enzymes: No results for input(s): CKTOTAL, CKMB, CKMBINDEX, TROPONINI in the last 168 hours. BNP (last 3 results) No results for input(s): PROBNP in the last 8760 hours. HbA1C: No results for input(s): HGBA1C in the last 72 hours. CBG: No results for input(s): GLUCAP in the last 168 hours. Lipid Profile: No results for input(s): CHOL, HDL, LDLCALC, TRIG, CHOLHDL, LDLDIRECT in the last 72 hours. Thyroid Function Tests: No results for input(s): TSH, T4TOTAL, FREET4, T3FREE, THYROIDAB in the last 72 hours. Anemia Panel: No results for input(s): VITAMINB12,  FOLATE, FERRITIN, TIBC, IRON, RETICCTPCT in the last 72 hours. Urine analysis: No results found for: COLORURINE, APPEARANCEUR, LABSPEC, PHURINE, GLUCOSEU, HGBUR, BILIRUBINUR, KETONESUR, PROTEINUR, UROBILINOGEN, NITRITE, LEUKOCYTESUR  Radiological Exams on Admission: US  Venous Img Lower Bilateral (DVT) Result Date: 01/25/2024 CLINICAL DATA:  Pulmonary emboli EXAM: BILATERAL LOWER EXTREMITY VENOUS DOPPLER ULTRASOUND TECHNIQUE: Gray-scale sonography with compression, as well as color and duplex ultrasound, were performed to evaluate the deep venous system(s) from the level of the common femoral vein through the popliteal and proximal calf veins. COMPARISON:  CT angiogram performed January 25, 2024 FINDINGS: VENOUS Normal compressibility of the common femoral, superficial femoral, and popliteal veins. Visualized portions of profunda femoral vein and great saphenous vein unremarkable. Nonocclusive thrombus is favored within a posterior tibial vein on the right. This vessel is noncompressible. OTHER None. Limitations: none IMPRESSION: 1. Nonocclusive thrombus is favored within a right posterior tibial vein. These results will be called to the ordering clinician or representative by the Radiologist Assistant, and communication documented in the PACS or [employer]. Electronically Signed   By: Maude Naegeli M.D.   On: 01/25/2024 08:46   CT Angio Chest PE W and/or Wo Contrast Result Date: 01/25/2024 CLINICAL DATA:  Shortness of breath. EXAM: CT ANGIOGRAPHY CHEST WITH CONTRAST TECHNIQUE: Multidetector CT imaging of the chest was performed using the standard protocol during bolus administration of intravenous contrast. Multiplanar CT image reconstructions and MIPs were obtained to evaluate the vascular anatomy. RADIATION DOSE REDUCTION: This exam was performed according to the departmental dose-optimization program which includes automated exposure control, adjustment of the mA  and/or kV according to patient size and/or use of iterative reconstruction technique. CONTRAST:  OMNIPAQUE  IOHEXOL  350 MG/ML SOLN COMPARISON:  None Available. FINDINGS: Cardiovascular: Satisfactory opacification of the pulmonary arteries to the segmental level. Marked severity areas of intraluminal low attenuation are seen involving the bilateral upper lobe, right middle lobe and bilateral lower lobe branches of the pulmonary arteries. Normal heart size with moderate severity coronary artery calcification. Mild right heart strain is noted (RV/LV ratio of 1.04). No pericardial effusion. Mediastinum/Nodes: No enlarged mediastinal, hilar, or axillary lymph nodes. Thyroid gland is not clearly identified. The trachea and esophagus demonstrate no significant findings.  Lungs/Pleura: There is mild diffuse interstitial thickening throughout both lungs. Mild atelectatic changes are seen within the posterior aspect of the bilateral lower lobes. No pleural effusion or pneumothorax is identified. Upper Abdomen: No acute abnormality. Musculoskeletal: No chest wall abnormality. No acute or significant osseous findings. Review of the MIP images confirms the above findings. IMPRESSION: 1. Marked severity bilateral pulmonary embolism with mild right heart strain (RV/LV ratio of 1.04). 2. Mild bilateral lower lobe atelectasis. 3. Moderate severity coronary artery calcification. Electronically Signed   By: Suzen Dials M.D.   On: 01/25/2024 01:56   DG Chest Portable 1 View Result Date: 01/25/2024 EXAM: 1 VIEW XRAY OF THE CHEST 01/25/2024 12:50:00 AM COMPARISON: 04/28/2009 CLINICAL HISTORY: SOB. Patient arrives from home via EMS. Patient complains of shortness of breath and chest pain when taking a deep breath. Patient was diagnosed with pneumonia and given 5 days of antibiotics. Patient reports the last 2 days he has had increased shortness of breath and been using his inhaler more frequently. FINDINGS: LUNGS AND PLEURA:  No focal pulmonary opacity. No pulmonary edema. No pleural effusion. No pneumothorax. HEART AND MEDIASTINUM: No acute abnormality of the cardiac and mediastinal silhouettes. BONES AND SOFT TISSUES: No acute osseous abnormality. IMPRESSION: 1. No acute process. Electronically signed by: Pinkie Pebbles MD 01/25/2024 12:55 AM EDT RP Workstation: [id number]    EKG: Independently reviewed.  Sinus rhythm, no acute ST changes.  Assessment/Plan Principal Problem:   Acute pulmonary embolism (HCC)  (please populate well all problems here in Problem List. (For example, if patient is on BP meds at home and you resume or decide to hold them, it is a problem that needs to be her. Same for CAD, COPD, HLD and so on)  Acute hypoxic respiratory failure  bilateral PE Right tibial DVT, unprovoked - Continue systemic anticoagulation, repeat H&H tomorrow, and plan for 6 months Eliquis  on discharge. - Referral to outpatient hematology for hypercoagulable state workup  Question of sleep apnea - O2 saturation decreased when patient sleeps wrist concern about OSA. - Outpatient follow-up with sleep study to rule out OSA  Elevated troponins - Likely from demand ischemia secondary to PE - No massive/submassive PE on CTA.  Vital signs stable.  Discussed with ICU attending, there is no indication for embolectomy at this point.  Continue systemic anticoagulation. - Echocardiogram - Hold off BP measurement today  HTN - Hold off atenolol  today  Hypothyroidism - Continue Synthroid   DVT prophylaxis: Heparin  drip Code Status: Full code Family Communication: Wife over the phone Disposition Plan: Expect more than 2 midnight hospital stay, patient has PE requiring systemic anticoagulation Consults called: Curbside consult with ICU attending Admission status: PCU admit   Cort ONEIDA Mana MD [facility] Pager [phone]  01/25/2024, 9:26 AM

## 2024-01-25 NOTE — ED Notes (Signed)
With CT 

## 2024-01-25 NOTE — Progress Notes (Signed)
 PHARMACY - ANTICOAGULATION CONSULT NOTE  Pharmacy Consult for heparin  dosage adjustment Indication: pulmonary embolus  Patient Measurements: Height: 6' (182.9 cm) Weight: 108.9 kg (240 lb) IBW/kg (Calculated) : 77.6 HEPARIN  DW (KG): 100.6  Labs: Recent Labs    01/25/24 0104 01/25/24 0214 01/25/24 0910 01/25/24 1823  HGB 14.3  --   --   --   HCT 40.7  --   --   --   PLT 221  --   --   --   HEPARINUNFRC  --   --  0.22* 0.45  CREATININE 0.76  --   --   --   TROPONINIHS 52* 49*  --   --     Estimated Creatinine Clearance: 123.6 mL/min (by C-G formula based on SCr of 0.76 mg/dL).   Medical History: Past Medical History:  Diagnosis Date   Asthma    GERD (gastroesophageal reflux disease)    OCC   History of angina    History of migraine headaches    Hypothyroidism    Hypothyroidism, unspecified 12/10/2016   Migraine headache 12/10/2016   Neuropathy 09/11/2015   Last Assessment & Plan:  Denies any new complaints.     Peyronie disease    Rectal bleeding    Routine history and physical examination of adult 04/04/2015   Overview:  Prevnar 2016.  Last Assessment & Plan:  He is agreeable to colonoscopy.   Ordered colonoscopy. Check labs today.   Sleep apnea    DOES NOT HAVE CPAP-HAD THE 1ST PART OF THE TEST DONE AND DID NOT GO BACK FOR THE 2ND PART   Assessment: 63 year old male presenting with bilateral PE being managed with heparin  infusion. Notable PMH hypothyroidism, asthma, migraine without aura. Renal function good with Scr 0.76 mg/dL (unknown baseline) and CrCl of 123.6 mL/min. CBC stable with hemglobin of 14.3 g/dL and platelet count 778 K/uL. Heparin  dosing weight of 100.6 kg adjusted for obesity.   Goal of Therapy:  Heparin  level 0.3-0.7 units/ml Monitor platelets by anticoagulation protocol: Yes   Plan:  --Heparin  level is therapeutic x 1 --Continue heparin  infusion at 1800 units/hr --Check confirmatory HL in 6 hours --Daily CBC per protocol while on IV  heparin   Marolyn KATHEE Mare 01/25/2024,7:39 PM

## 2024-01-25 NOTE — ED Provider Notes (Signed)
 [facility] Provider Note    Event Date/Time   First MD Initiated Contact with Patient 01/25/24 0031     (approximate)   History   Shortness of Breath   HPI  Jesus Dean is a 63 y.o. male with history of asthma, hypothyroidism, migraines, sleep apnea who presents to the emergency department with complaints of shortness of breath, pleuritic chest pain.  States symptoms started today.  No lower extremity swelling or discomfort.  Sats were 88% on room air with EMS.  Does not wear oxygen chronically.  No fevers, cough.  Has not been wheezing.  Denies history of PE, DVT, CHF.  Patient does report that he recently was treated for pneumonia as an outpatient and finished a 5-day course of antibiotics.  States he was in [facility] [place] .  He reports that he lives here in [place].  He has been living in his car for the past couple of years.   History provided by patient, EMS.    Past Medical History:  Diagnosis Date   Asthma    GERD (gastroesophageal reflux disease)    OCC   History of angina    History of migraine headaches    Hypothyroidism    Hypothyroidism, unspecified 12/10/2016   Migraine headache 12/10/2016   Neuropathy 09/11/2015   Last Assessment & Plan:  Denies any new complaints.     Peyronie disease    Rectal bleeding    Routine history and physical examination of adult 04/04/2015   Overview:  Prevnar 2016.  Last Assessment & Plan:  He is agreeable to colonoscopy.   Ordered colonoscopy. Check labs today.   Sleep apnea    DOES NOT HAVE CPAP-HAD THE 1ST PART OF THE TEST DONE AND DID NOT GO BACK FOR THE 2ND PART    Past Surgical History:  Procedure Laterality Date   hernia Left    HERNIA REPAIR Right    INGUINAL HERNIA REPAIR Left 06/23/2017   Procedure: LAPAROSCOPIC INGUINAL HERNIA;  Surgeon: Desiderio Schanz, MD;  Location: [facility] ORS;  Service: General;  Laterality: Left;   INNER EAR SURGERY     MULTIPLE SURGERIES   MYRINGOTOMY WITH TUBE  PLACEMENT Bilateral 08/23/2020   Procedure: MYRINGOTOMY WITH TUBE PLACEMENT;  Surgeon: Juengel, Paul, MD;  Location: [facility];  Service: ENT;  Laterality: Bilateral;  COVID positive 07/25/20   THYROIDECTOMY     TONSILLECTOMY AND ADENOIDECTOMY     VASECTOMY      MEDICATIONS:  Prior to Admission medications   Medication Sig Start Date End Date Taking? Authorizing Provider  atorvastatin  (LIPITOR) 20 MG tablet Take 20 mg by mouth daily.   Yes [provider]  famotidine  (PEPCID ) 20 MG tablet Take 1 tablet (20 mg total) by mouth 2 (two) times daily for 5 days. 02/06/21 01/25/24 Yes Woods, Jaclyn M, PA-C  levothyroxine  (SYNTHROID , LEVOTHROID) 200 MCG tablet Take 200 mcg by mouth at bedtime.  07/18/16  Yes [provider]  acetaminophen  (TYLENOL ) 500 MG tablet Take 2 tablets (1,000 mg total) by mouth every 6 (six) hours as needed for mild pain, fever or headache. Patient not taking: No sig reported 06/24/17   Desiderio Schanz, MD  albuterol  (PROVENTIL  HFA;VENTOLIN  HFA) 108 (90 Base) MCG/ACT inhaler Inhale 2 puffs into the lungs every 6 (six) hours as needed for wheezing or shortness of breath.  07/18/16   [provider]  atenolol  (TENORMIN ) 25 MG tablet Take 25 mg by mouth at bedtime.  08/14/16 01/25/24  [provider]  diphenhydrAMINE  (BENADRYL  ALLERGY) 25 mg capsule Take 1 capsule (25 mg total) by mouth every 6 (six) hours as needed for up to 5 days. 02/06/21 02/11/21  Woods, Jaclyn M, PA-C  EPINEPHrine  0.3 mg/0.3 mL IJ SOAJ injection Inject 0.3 mg into the muscle as needed for anaphylaxis. 02/07/21   Woods, Jaclyn M, PA-C  ibuprofen  (ADVIL ,MOTRIN ) 600 MG tablet Take 1 tablet (600 mg total) by mouth every 8 (eight) hours as needed for headache or moderate pain. 06/24/17   Desiderio Schanz, MD  predniSONE  (STERAPRED UNI-PAK 21 TAB) 10 MG (21) TBPK tablet 6,5,4,3,2,1 02/06/21   Woods, Jaclyn M, PA-C    Physical Exam   Triage Vital Signs: ED Triage Vitals  Encounter Vitals  Group     BP 01/25/24 0034 133/86     Girls Systolic BP Percentile --      Girls Diastolic BP Percentile --      Boys Systolic BP Percentile --      Boys Diastolic BP Percentile --      Pulse Rate 01/25/24 0034 83     Resp 01/25/24 0034 20     Temp 01/25/24 0039 97.8 F (36.6 C)     Temp Source 01/25/24 0039 Oral     SpO2 01/25/24 0031 93 %     Weight 01/25/24 0035 240 lb (108.9 kg)     Height 01/25/24 0035 6' (1.829 m)     Head Circumference --      Peak Flow --      Pain Score 01/25/24 0035 6     Pain Loc --      Pain Education --      Exclude from Growth Chart --     Most recent vital signs: Vitals:   01/25/24 0410 01/25/24 0735  BP:  120/80  Pulse:  69  Resp:  14  Temp:  97.7 F (36.5 C)  SpO2: 94% 91%    CONSTITUTIONAL: Alert, responds appropriately to questions. Well-appearing; well-nourished HEAD: Normocephalic, atraumatic EYES: Conjunctivae clear, pupils appear equal, sclera nonicteric ENT: normal nose; moist mucous membranes NECK: Supple, normal ROM CARD: RRR; S1 and S2 appreciated RESP: Normal chest excursion without splinting or tachypnea; breath sounds clear and equal bilaterally; no wheezes, no rhonchi, no rales, no hypoxia on 2 L nasal cannula or respiratory distress, speaking full sentences, diminished aeration at bases bilaterally ABD/GI: Non-distended; soft, non-tender, no rebound, no guarding, no peritoneal signs BACK: The back appears normal EXT: Normal ROM in all joints; no deformity noted, no edema, no calf swelling or calf tenderness SKIN: Normal color for age and race; warm; no rash on exposed skin NEURO: Moves all extremities equally, normal speech PSYCH: The patient's mood and manner are appropriate.   ED Results / Procedures / Treatments   LABS: (all labs ordered are listed, but only abnormal results are displayed) Labs Reviewed  BASIC METABOLIC PANEL WITH GFR - Abnormal; Notable for the following components:      Result Value    Glucose, Bld 191 (*)    All other components within normal limits  CBC WITH DIFFERENTIAL/PLATELET - Abnormal; Notable for the following components:   WBC 10.8 (*)    Neutro Abs 7.9 (*)    All other components within normal limits  TROPONIN I (HIGH SENSITIVITY) - Abnormal; Notable for the following components:   Troponin I (High Sensitivity) 52 (*)    All other components within normal limits  TROPONIN I (HIGH SENSITIVITY) - Abnormal; Notable for the following  components:   Troponin I (High Sensitivity) 49 (*)    All other components within normal limits  RESP PANEL BY RT-PCR (RSV, FLU A&B, COVID)  RVPGX2  CULTURE, BLOOD (SINGLE)  PROCALCITONIN  LACTIC ACID, PLASMA  HEPARIN  LEVEL (UNFRACTIONATED)  HIV ANTIBODY (ROUTINE TESTING W REFLEX)     EKG:  EKG Interpretation Date/Time:  Monday January 25 2024 00:36:03 EDT Ventricular Rate:  86 PR Interval:  147 QRS Duration:  101 QT Interval:  380 QTC Calculation: 455 R Axis:   10  Text Interpretation: Sinus rhythm Low voltage, precordial leads Abnormal R-wave progression, early transition Borderline T abnormalities, inferior leads Confirmed by Neomi Neptune [phone]) on 01/25/2024 12:48:54 AM         RADIOLOGY: My personal review and interpretation of imaging: CTA chest shows bilateral PEs.  I have personally reviewed all radiology reports.   CT Angio Chest PE W and/or Wo Contrast Result Date: 01/25/2024 CLINICAL DATA:  Shortness of breath. EXAM: CT ANGIOGRAPHY CHEST WITH CONTRAST TECHNIQUE: Multidetector CT imaging of the chest was performed using the standard protocol during bolus administration of intravenous contrast. Multiplanar CT image reconstructions and MIPs were obtained to evaluate the vascular anatomy. RADIATION DOSE REDUCTION: This exam was performed according to the departmental dose-optimization program which includes automated exposure control, adjustment of the mA and/or kV according to patient size and/or use of iterative  reconstruction technique. CONTRAST:  OMNIPAQUE  IOHEXOL  350 MG/ML SOLN COMPARISON:  None Available. FINDINGS: Cardiovascular: Satisfactory opacification of the pulmonary arteries to the segmental level. Marked severity areas of intraluminal low attenuation are seen involving the bilateral upper lobe, right middle lobe and bilateral lower lobe branches of the pulmonary arteries. Normal heart size with moderate severity coronary artery calcification. Mild right heart strain is noted (RV/LV ratio of 1.04). No pericardial effusion. Mediastinum/Nodes: No enlarged mediastinal, hilar, or axillary lymph nodes. Thyroid gland is not clearly identified. The trachea and esophagus demonstrate no significant findings. Lungs/Pleura: There is mild diffuse interstitial thickening throughout both lungs. Mild atelectatic changes are seen within the posterior aspect of the bilateral lower lobes. No pleural effusion or pneumothorax is identified. Upper Abdomen: No acute abnormality. Musculoskeletal: No chest wall abnormality. No acute or significant osseous findings. Review of the MIP images confirms the above findings. IMPRESSION: 1. Marked severity bilateral pulmonary embolism with mild right heart strain (RV/LV ratio of 1.04). 2. Mild bilateral lower lobe atelectasis. 3. Moderate severity coronary artery calcification. Electronically Signed   By: Suzen Dials M.D.   On: 01/25/2024 01:56   DG Chest Portable 1 View Result Date: 01/25/2024 EXAM: 1 VIEW XRAY OF THE CHEST 01/25/2024 12:50:00 AM COMPARISON: 04/28/2009 CLINICAL HISTORY: SOB. Patient arrives from home via EMS. Patient complains of shortness of breath and chest pain when taking a deep breath. Patient was diagnosed with pneumonia and given 5 days of antibiotics. Patient reports the last 2 days he has had increased shortness of breath and been using his inhaler more frequently. FINDINGS: LUNGS AND PLEURA: No focal pulmonary opacity. No pulmonary edema. No pleural  effusion. No pneumothorax. HEART AND MEDIASTINUM: No acute abnormality of the cardiac and mediastinal silhouettes. BONES AND SOFT TISSUES: No acute osseous abnormality. IMPRESSION: 1. No acute process. Electronically signed by: Pinkie Pebbles MD 01/25/2024 12:55 AM EDT RP Workstation: [id number]     PROCEDURES:  Critical Care performed: Yes, see critical care procedure note(s)   CRITICAL CARE Performed by: Neptune Neomi   Total critical care time: 30 minutes  Critical care time was exclusive  of separately billable procedures and treating other patients.  Critical care was necessary to treat or prevent imminent or life-threatening deterioration.  Critical care was time spent personally by me on the following activities: development of treatment plan with patient and/or surrogate as well as nursing, discussions with consultants, evaluation of patient's response to treatment, examination of patient, obtaining history from patient or surrogate, ordering and performing treatments and interventions, ordering and review of laboratory studies, ordering and review of radiographic studies, pulse oximetry and re-evaluation of patient's condition.   SABRA1-3 Lead EKG Interpretation  Performed by: Hikaru Delorenzo, Josette SAILOR, DO Authorized by: Mavric Cortright, Josette SAILOR, DO     Interpretation: normal     ECG rate:  73   ECG rate assessment: normal     Rhythm: sinus rhythm     Ectopy: none     Conduction: normal       IMPRESSION / MDM / ASSESSMENT AND PLAN / ED COURSE  I reviewed the triage vital signs and the nursing notes.    Patient here with shortness of breath, pleuritic chest pain, new oxygen requirement.  The patient is on the cardiac monitor to evaluate for evidence of arrhythmia and/or significant heart rate changes.   DIFFERENTIAL DIAGNOSIS (includes but not limited to):   PE, asthma exacerbation, pneumonia, pneumothorax, CHF   Patient's presentation is most consistent with acute presentation with  potential threat to life or bodily function.   PLAN: Will obtain labs, CTA of the chest.  He does have diminished aeration but no wheezing.  Will give breathing treatment, Solu-Medrol  in case this is an asthma exacerbation given his new oxygen requirement but given his pleuritic chest pain I am concerned about a PE.  Also recurrent pneumonia on the differential given he was recently diagnosed with pneumonia in [facility] [place]  and reports finishing a 5-day course of 2 antibiotics.   MEDICATIONS GIVEN IN ED: Medications  heparin  ADULT infusion 100 units/mL (25000 units/250mL) (1,600 Units/hr Intravenous Infusion Verify 01/25/24 0412)  Oral care mouth rinse (has no administration in time range)  0.9 %  sodium chloride  infusion (has no administration in time range)  acetaminophen  (TYLENOL ) tablet 650 mg (has no administration in time range)    Or  acetaminophen  (TYLENOL ) suppository 650 mg (has no administration in time range)  traZODone  (DESYREL ) tablet 25 mg (has no administration in time range)  magnesium  hydroxide (MILK OF MAGNESIA) suspension 30 mL (has no administration in time range)  ondansetron  (ZOFRAN ) tablet 4 mg (has no administration in time range)    Or  ondansetron  (ZOFRAN ) injection 4 mg (has no administration in time range)  aspirin  EC tablet 81 mg (has no administration in time range)  ipratropium-albuterol  (DUONEB) 0.5-2.5 (3) MG/3ML nebulizer solution 3 mL (3 mLs Nebulization Given 01/25/24 0052)  methylPREDNISolone  sodium succinate (SOLU-MEDROL ) 125 mg/2 mL injection 125 mg (125 mg Intravenous Given 01/25/24 0159)  ipratropium-albuterol  (DUONEB) 0.5-2.5 (3) MG/3ML nebulizer solution 3 mL (3 mLs Nebulization Given 01/25/24 0159)  iohexol  (OMNIPAQUE ) 350 MG/ML injection 100 mL (100 mLs Intravenous Contrast Given 01/25/24 0146)  aspirin  chewable tablet 324 mg (324 mg Oral Given 01/25/24 0212)  heparin  bolus via infusion 5,000 Units (5,000 Units Intravenous Bolus from Bag 01/25/24  0217)     ED COURSE: Patient's first troponin elevated at 52.  Repeat pending.  Will give full dose aspirin .  Chest x-ray reviewed and interpreted by myself and the radiologist and is clear.  COVID, flu negative.  Procalcitonin negative.  Low suspicion that this is  infectious in nature although patient does have a leukocytosis of 10.8.  Normal lactic.  CTA chest reviewed and interpreted by myself and the radiologist and shows bilateral PEs with mild right heart strain.  Will discuss with hospitalist for admission.  Will start heparin .   CONSULTS:  Consulted and discussed patient's case with hospitalist, Dr. Lawence.  I have recommended admission and consulting physician agrees and will place admission orders.  Patient (and family if present) agree with this plan.   I reviewed all nursing notes, vitals, pertinent previous records.  All labs, EKGs, imaging ordered have been independently reviewed and interpreted by myself.    Patient's oxygen saturation does drop when he falls asleep because he is breathing through his mouth.  Will try high flow nasal cannula but will also order CPAP while sleeping.  Hospitalist agrees.  OUTSIDE RECORDS REVIEWED: Reviewed last PCP note on 10/13/2023.       FINAL CLINICAL IMPRESSION(S) / ED DIAGNOSES   Final diagnoses:  Acute respiratory failure with hypoxia (HCC)  Acute pulmonary embolism, unspecified pulmonary embolism type, unspecified whether acute cor pulmonale present (HCC)     Rx / DC Orders   ED Discharge Orders     None        Note:  This document was prepared using Dragon voice recognition software and may include unintentional dictation errors.   Devann Cribb, Josette SAILOR, DO 01/25/24 [phone]

## 2024-01-25 NOTE — Progress Notes (Signed)
 PHARMACY - ANTICOAGULATION CONSULT NOTE  Pharmacy Consult for Heparin   Indication: pulmonary embolus  No Known Allergies  Patient Measurements: Height: 6' (182.9 cm) Weight: 108.9 kg (240 lb) IBW/kg (Calculated) : 77.6 HEPARIN  DW (KG): 100.6  Vital Signs: Temp: 97.8 F (36.6 C) (07/21 0039) Temp Source: Oral (07/21 0039) BP: 133/86 (07/21 0034) Pulse Rate: 83 (07/21 0034)  Labs: Recent Labs    01/25/24 0104  HGB 14.3  HCT 40.7  PLT 221  CREATININE 0.76  TROPONINIHS 52*    Estimated Creatinine Clearance: 123.6 mL/min (by C-G formula based on SCr of 0.76 mg/dL).   Medical History: Past Medical History:  Diagnosis Date   Asthma    GERD (gastroesophageal reflux disease)    OCC   History of angina    History of migraine headaches    Hypothyroidism    Hypothyroidism, unspecified 12/10/2016   Migraine headache 12/10/2016   Neuropathy 09/11/2015   Last Assessment & Plan:  Denies any new complaints.     Peyronie disease    Rectal bleeding    Routine history and physical examination of adult 04/04/2015   Overview:  Prevnar 2016.  Last Assessment & Plan:  He is agreeable to colonoscopy.   Ordered colonoscopy. Check labs today.   Sleep apnea    DOES NOT HAVE CPAP-HAD THE 1ST PART OF THE TEST DONE AND DID NOT GO BACK FOR THE 2ND PART    Medications:  (Not in a hospital admission)   Assessment: Pharmacy consulted to dose heparin  in this 63 year old male admitted with PE.  No prior anticoag noted. CrCl = 123.6 ml/min   Goal of Therapy:  Heparin  level 0.3-0.7 units/ml Monitor platelets by anticoagulation protocol: Yes   Plan:  Give 5000 units bolus x 1 Start heparin  infusion at 1600 units/hr Check anti-Xa level in 6 hours and daily while on heparin  Continue to monitor H&H and platelets  Wilder Kurowski D 01/25/2024,2:07 AM

## 2024-01-26 ENCOUNTER — Other Ambulatory Visit (HOSPITAL_COMMUNITY): Payer: Self-pay

## 2024-01-26 ENCOUNTER — Telehealth (HOSPITAL_COMMUNITY): Payer: Self-pay | Admitting: Pharmacy Technician

## 2024-01-26 DIAGNOSIS — I2699 Other pulmonary embolism without acute cor pulmonale: Secondary | ICD-10-CM | POA: Diagnosis not present

## 2024-01-26 LAB — BASIC METABOLIC PANEL WITH GFR
Anion gap: 10 (ref 5–15)
BUN: 16 mg/dL (ref 8–23)
CO2: 26 mmol/L (ref 22–32)
Calcium: 8.7 mg/dL — ABNORMAL LOW (ref 8.9–10.3)
Chloride: 102 mmol/L (ref 98–111)
Creatinine, Ser: 0.55 mg/dL — ABNORMAL LOW (ref 0.61–1.24)
GFR, Estimated: >60 mL/min (ref >60)
Glucose, Bld: 201 mg/dL — ABNORMAL HIGH (ref 70–99)
Potassium: 3.9 mmol/L (ref 3.5–5.1)
Sodium: 138 mmol/L (ref 135–145)

## 2024-01-26 LAB — CBC
HCT: 38.3 % — ABNORMAL LOW (ref 39.0–52.0)
Hemoglobin: 13.4 g/dL (ref 13.0–17.0)
MCH: 30.8 pg (ref 26.0–34.0)
MCHC: 35 g/dL (ref 30.0–36.0)
MCV: 88 fL (ref 80.0–100.0)
Platelets: 247 K/uL (ref 150–400)
RBC: 4.35 MIL/uL (ref 4.22–5.81)
RDW: 13 % (ref 11.5–15.5)
WBC: 14.5 K/uL — ABNORMAL HIGH (ref 4.0–10.5)
nRBC: 0 % (ref 0.0–0.2)

## 2024-01-26 LAB — HEPARIN LEVEL (UNFRACTIONATED): Heparin Unfractionated: 0.44 [IU]/mL (ref 0.30–0.70)

## 2024-01-26 MED ORDER — APIXABAN 5 MG PO TABS
10.0000 mg | ORAL_TABLET | Freq: Two times a day (BID) | ORAL | Status: DC
  Administered 2024-01-26 – 2024-01-28 (×5): 10 mg via ORAL
  Filled 2024-01-26 (×5): qty 2

## 2024-01-26 MED ORDER — LOPERAMIDE HCL 2 MG PO CAPS
2.0000 mg | ORAL_CAPSULE | ORAL | Status: DC | PRN
  Administered 2024-01-26: 2 mg via ORAL
  Filled 2024-01-26 (×2): qty 1

## 2024-01-26 MED ORDER — APIXABAN 5 MG PO TABS: 5.0000 mg | ORAL_TABLET | Freq: Two times a day (BID) | ORAL | Status: DC

## 2024-01-26 NOTE — Telephone Encounter (Signed)
 Patient Product/process development scientist completed.    The patient is insured through [employer]. Patient has [employer], may use a copay card, and/or apply for patient assistance if available.    Ran test claim for Eliquis 5 mg and the current 30 day co-pay is $50.00.   This test claim was processed through [facility]- copay amounts may vary at other pharmacies due to pharmacy/plan contracts, or as the patient moves through the different stages of their insurance plan.     Roland Earl, CPHT Pharmacy Technician III Certified Patient Advocate [facility] Patient Advocate Team Direct Number: [phone]  Fax: [phone]

## 2024-01-26 NOTE — Progress Notes (Signed)
 PHARMACY - ANTICOAGULATION CONSULT NOTE  Pharmacy Consult for heparin  dosage adjustment Indication: pulmonary embolus  Patient Measurements: Height: 6' (182.9 cm) Weight: 108.9 kg (240 lb) IBW/kg (Calculated) : 77.6 HEPARIN  DW (KG): 100.6  Labs: Recent Labs    01/25/24 0104 01/25/24 0214 01/25/24 0910 01/25/24 1823 01/26/24 0016  HGB 14.3  --   --   --   --   HCT 40.7  --   --   --   --   PLT 221  --   --   --   --   HEPARINUNFRC  --   --  0.22* 0.45 0.44  CREATININE 0.76  --   --   --   --   TROPONINIHS 52* 49*  --   --   --     Estimated Creatinine Clearance: 123.6 mL/min (by C-G formula based on SCr of 0.76 mg/dL).   Medical History: Past Medical History:  Diagnosis Date   Asthma    GERD (gastroesophageal reflux disease)    OCC   History of angina    History of migraine headaches    Hypothyroidism    Hypothyroidism, unspecified 12/10/2016   Migraine headache 12/10/2016   Neuropathy 09/11/2015   Last Assessment & Plan:  Denies any new complaints.     Peyronie disease    Rectal bleeding    Routine history and physical examination of adult 04/04/2015   Overview:  Prevnar 2016.  Last Assessment & Plan:  He is agreeable to colonoscopy.   Ordered colonoscopy. Check labs today.   Sleep apnea    DOES NOT HAVE CPAP-HAD THE 1ST PART OF THE TEST DONE AND DID NOT GO BACK FOR THE 2ND PART   Assessment: 63 year old male presenting with bilateral PE being managed with heparin  infusion. Notable PMH hypothyroidism, asthma, migraine without aura. Renal function good with Scr 0.76 mg/dL (unknown baseline) and CrCl of 123.6 mL/min. CBC stable with hemglobin of 14.3 g/dL and platelet count 778 K/uL. Heparin  dosing weight of 100.6 kg adjusted for obesity.   Goal of Therapy:  Heparin  level 0.3-0.7 units/ml Monitor platelets by anticoagulation protocol: Yes   Plan:  --Heparin  level is therapeutic x 2 --Continue heparin  infusion at 1800 units/hr --Recheck HL daily w/ AM labs (Next 7/23  @ 0300) --Daily CBC per protocol while on IV heparin   Rankin CANDIE Dills, PharmD, MBA 01/26/2024 1:28 AM

## 2024-01-26 NOTE — Progress Notes (Signed)
 PROGRESS NOTE  Jesus Dean FMW:[id number] DOB: 1961-05-22 DOA: 01/25/2024 PCP: Kristie Norleen BIRCH, MD   LOS: 1 day   Brief narrative:   Jesus Dean is a 63 y.o. male with medical history significant of HTN, hypothyroidism, migraine headache, presented to the hospital with chest pain that started 3 weeks ago pleuritic in nature with worsening pain during deep breaths and coughing with exertional shortness of breath and dyspnea.  He had initially gone to a local hospital at [facility], when he was diagnosed with pneumonia and completed p.o. antibiotic for 5 days.  Afterwards however patient continued to experience pleuritic chest pain and he also noticed increasing of lower extremity swelling.  Patient then presented to the ED.  In the ED patient had a stable blood pressure but needed oxygen 6 L to maintain saturation of 93%.  CTA of the chest was done which was positive for  bilateral PE with mild right heart strain.  DVT study positive for right tibial nonocclusive DVT.    Troponin 52> 49.  Patient was then admitted hospital for further evaluation and treatment.       Assessment/Plan: Principal Problem:   Acute pulmonary embolism (HCC) Active Problems:   Hypothyroidism, unspecified   Chest pain on breathing    Acute pulmonary embolism  CTA of the chest showed bilateral PE with mild right heart strain.  2D echocardiogram done did not show any evidence of RV strain.  LV ejection fraction was 60 to 65%.  Currently on heparin  drip and no plans for intervention.  Vitals are stable.  Will change heparin  drip to Eliquis .  Still on supplemental oxygen at 6 L/min and easily desaturates..  Patient states that he is very mobile at work which involves some mechanical work.  Unsure if he had any trauma to his leg.  Might benefit from hypercoagulable workup and hematology follow-up as outpatient.   Acute hypoxic respiratory failure secondary to bilateral PE On supplemental oxygen.  Will  continue to wean as possible.  Currently on 6 L of oxygen and easily desaturates.  Right tibial DVT,  - Continue with IV heparin .  Plan for Eliquis  on discharge..  Will  benefit from outpatient hematology follow-up for hypercoagulable state state workup.     Question of sleep apnea - O2 saturation decreased when patient sleeps with concern about OSA. - Outpatient follow-up with sleep study to rule out OSA   Elevated troponins - Likely from demand ischemia secondary to PE.  No massive or submassive PE on the CTA.  Vital signs stable.  Admitting provider had spoken with ICU attending and no indication for embolectomy.  Continue systemic anticoagulation.  2D echocardiogram with preserved LV function and no RV strain.  HTN - Hold off atenolol  for now.   Hypothyroidism - Continue Synthroid   DVT prophylaxis: On heparin  drip.  Will change to Eliquis .   Disposition: Home likely 01/27/2024 if oxygenation improves  Status is: Inpatient Remains inpatient appropriate because: Hypoxic respiratory failure secondary to pulmonary embolism,    Code Status:     Code Status: Full Code  Family Communication: None  Consultants: None  Procedures: None  Anti-infectives:  None  Anti-infectives (From admission, onward)    None      Subjective: Today, patient was seen and examined at bedside.  Feels a little better with pain today but he still has chest pain on taking a deep breath 3 x 10 intensity and complains of dyspnea on exertion and desaturation.  Still needing  6 L of oxygen.  Objective: Vitals:   01/26/24 0855 01/26/24 1236  BP: (!) 118/53 102/64  Pulse: (!) 55 (!) 53  Resp:    Temp: 97.8 F (36.6 C) 97.8 F (36.6 C)  SpO2: 100% 97%    Intake/Output Summary (Last 24 hours) at 01/26/2024 1327 Last data filed at 01/26/2024 0900 Gross per 24 hour  Intake 2363.48 ml  Output 700 ml  Net 1663.48 ml   Filed Weights   01/25/24 0035  Weight: 108.9 kg   Body mass index is  32.55 kg/m.   Physical Exam:  GENERAL: Patient is alert awake and oriented. Not in obvious distress.  Obese, on nasal cannula oxygen HENT: No scleral pallor or icterus. Pupils equally reactive to light. Oral mucosa is Dean NECK: is supple, no gross swelling noted. CHEST:  Diminished breath sounds bilaterally. CVS: S1 and S2 heard, no murmur. Regular rate and rhythm.  ABDOMEN: Soft, non-tender, bowel sounds are present. EXTREMITIES: Edema on the right leg positive. CNS: Cranial nerves are intact. No focal motor deficits. SKIN: warm and dry without rashes.  Data Review: I have personally reviewed the following laboratory data and studies,  CBC: Recent Labs  Lab 01/25/24 0104 01/26/24 0416  WBC 10.8* 14.5*  NEUTROABS 7.9*  --   HGB 14.3 13.4  HCT 40.7 38.3*  MCV 86.0 88.0  PLT 221 247   Basic Metabolic Panel: Recent Labs  Lab 01/25/24 0104 01/26/24 0416  NA 139 138  K 4.1 3.9  CL 102 102  CO2 26 26  GLUCOSE 191* 201*  BUN 20 16  CREATININE 0.76 0.55*  CALCIUM  8.9 8.7*   Liver Function Tests: No results for input(s): AST, ALT, ALKPHOS, BILITOT, PROT, ALBUMIN in the last 168 hours. No results for input(s): LIPASE, AMYLASE in the last 168 hours. No results for input(s): AMMONIA in the last 168 hours. Cardiac Enzymes: No results for input(s): CKTOTAL, CKMB, CKMBINDEX, TROPONINI in the last 168 hours. BNP (last 3 results) No results for input(s): BNP in the last 8760 hours.  ProBNP (last 3 results) No results for input(s): PROBNP in the last 8760 hours.  CBG: No results for input(s): GLUCAP in the last 168 hours. Recent Results (from the past 240 hours)  Resp panel by RT-PCR (RSV, Flu A&B, Covid) Anterior Nasal Swab     Status: None   Collection Time: 01/25/24  1:04 AM   Specimen: Anterior Nasal Swab  Result Value Ref Range Status   SARS Coronavirus 2 by RT PCR NEGATIVE NEGATIVE Final    Comment: (NOTE) SARS-CoV-2 target nucleic  acids are NOT DETECTED.  The SARS-CoV-2 RNA is generally detectable in upper respiratory specimens during the acute phase of infection. The lowest concentration of SARS-CoV-2 viral copies this assay can detect is 138 copies/mL. A negative result does not preclude SARS-Cov-2 infection and should not be used as the sole basis for treatment or other patient management decisions. A negative result may occur with  improper specimen collection/handling, submission of specimen other than nasopharyngeal swab, presence of viral mutation(s) within the areas targeted by this assay, and inadequate number of viral copies(<138 copies/mL). A negative result must be combined with clinical observations, patient history, and epidemiological information. The expected result is Negative.  Fact Sheet for Patients:  BloggerCourse.com  Fact Sheet for Healthcare Providers:  SeriousBroker.it  This test is no t yet approved or cleared by the [place]  FDA and  has been authorized for detection and/or diagnosis of SARS-CoV-2 by FDA under  an Emergency Use Authorization (EUA). This EUA will remain  in effect (meaning this test can be used) for the duration of the COVID-19 declaration under Section 564(b)(1) of the Act, 21 U.S.C.section 360bbb-3(b)(1), unless the authorization is terminated  or revoked sooner.       Influenza A by PCR NEGATIVE NEGATIVE Final   Influenza B by PCR NEGATIVE NEGATIVE Final    Comment: (NOTE) The Xpert Xpress SARS-CoV-2/FLU/RSV plus assay is intended as an aid in the diagnosis of influenza from Nasopharyngeal swab specimens and should not be used as a sole basis for treatment. Nasal washings and aspirates are unacceptable for Xpert Xpress SARS-CoV-2/FLU/RSV testing.  Fact Sheet for Patients: BloggerCourse.com  Fact Sheet for Healthcare Providers: SeriousBroker.it  This  test is not yet approved or cleared by the [place]  FDA and has been authorized for detection and/or diagnosis of SARS-CoV-2 by FDA under an Emergency Use Authorization (EUA). This EUA will remain in effect (meaning this test can be used) for the duration of the COVID-19 declaration under Section 564(b)(1) of the Act, 21 U.S.C. section 360bbb-3(b)(1), unless the authorization is terminated or revoked.     Resp Syncytial Virus by PCR NEGATIVE NEGATIVE Final    Comment: (NOTE) Fact Sheet for Patients: BloggerCourse.com  Fact Sheet for Healthcare Providers: SeriousBroker.it  This test is not yet approved or cleared by the [place]  FDA and has been authorized for detection and/or diagnosis of SARS-CoV-2 by FDA under an Emergency Use Authorization (EUA). This EUA will remain in effect (meaning this test can be used) for the duration of the COVID-19 declaration under Section 564(b)(1) of the Act, 21 U.S.C. section 360bbb-3(b)(1), unless the authorization is terminated or revoked.  Performed at [facility], [address] [place], [place] [address]   Blood culture (single)     Status: None (Preliminary result)   Collection Time: 01/25/24  1:04 AM   Specimen: BLOOD  Result Value Ref Range Status   Specimen Description BLOOD RIGHT [facility]  Final   Special Requests   Final    BOTTLES DRAWN AEROBIC AND ANAEROBIC Blood Culture adequate volume   Culture   Final    NO GROWTH < 12 HOURS Performed at [facility], [address] [place], [place] [address]    Report Status PENDING  Incomplete     Studies: ECHOCARDIOGRAM COMPLETE Result Date: 01/25/2024    ECHOCARDIOGRAM REPORT   Patient Name:   Jesus Dean Date of Exam: 01/25/2024 Medical Rec #:  [id number]     Height:       72.0 in Accession #:    [id number]    Weight:       240.0 lb Date of Birth:  [birth year]     BSA:          2.302 m Patient Age:    62 years       BP:           120/75 mmHg Patient Gender: M             HR:           64 bpm. Exam Location:  [facility] Procedure: 2D Echo, Cardiac Doppler and Color Doppler (Both Spectral and Color            Flow Doppler were utilized during procedure). Indications:     Pulmonary embolus I26.09  History:         Patient has no prior history of Echocardiogram examinations.  Risk Factors:Sleep Apnea. History of migraine, History of                  angina.  Sonographer:     Christopher Furnace Referring Phys:  [id number] CORT ONEIDA MANA Diagnosing Phys: Lonni Hanson MD IMPRESSIONS  1. Left ventricular ejection fraction, by estimation, is 60 to 65%. The left ventricle has normal function. The left ventricle has no regional wall motion abnormalities. Left ventricular diastolic parameters are consistent with Grade I diastolic dysfunction (impaired relaxation).  2. Right ventricular systolic function is low normal. The right ventricular size is moderately enlarged. Tricuspid regurgitation signal is inadequate for assessing PA pressure.  3. Right atrial size was mildly dilated.  4. The mitral valve is normal in structure. No evidence of mitral valve regurgitation. No evidence of mitral stenosis.  5. The aortic valve has an indeterminant number of cusps. There is mild thickening of the aortic valve. Aortic valve regurgitation is not visualized. Aortic valve sclerosis is present, with no evidence of aortic valve stenosis. FINDINGS  Left Ventricle: Left ventricular ejection fraction, by estimation, is 60 to 65%. The left ventricle has normal function. The left ventricle has no regional wall motion abnormalities. The left ventricular internal cavity size was normal in size. There is  borderline left ventricular hypertrophy. Left ventricular diastolic parameters are consistent with Grade I diastolic dysfunction (impaired relaxation). Right Ventricle: The right ventricular size is moderately enlarged. No increase in right ventricular wall  thickness. Right ventricular systolic function is low normal. Tricuspid regurgitation signal is inadequate for assessing PA pressure. Left Atrium: Left atrial size was normal in size. Right Atrium: Right atrial size was mildly dilated. Pericardium: The pericardium was not well visualized. Mitral Valve: The mitral valve is normal in structure. No evidence of mitral valve regurgitation. No evidence of mitral valve stenosis. MV peak gradient, 2.3 mmHg. The mean mitral valve gradient is 1.0 mmHg. Tricuspid Valve: The tricuspid valve is grossly normal. Tricuspid valve regurgitation is trivial. No evidence of tricuspid stenosis. Aortic Valve: The aortic valve has an indeterminant number of cusps. There is mild thickening of the aortic valve. Aortic valve regurgitation is not visualized. Aortic valve sclerosis is present, with no evidence of aortic valve stenosis. Aortic valve mean gradient measures 3.0 mmHg. Aortic valve peak gradient measures 6.5 mmHg. Aortic valve area, by VTI measures 2.79 cm. Pulmonic Valve: The pulmonic valve was not well visualized. Pulmonic valve regurgitation is not visualized. No evidence of pulmonic stenosis. Aorta: The aortic root is normal in size and structure. Pulmonary Artery: The pulmonary artery is not well seen. IAS/Shunts: The interatrial septum was not well visualized.  LEFT VENTRICLE PLAX 2D LVIDd:         5.30 cm   Diastology LVIDs:         3.80 cm   LV e' medial:    6.42 cm/s LV PW:         1.00 cm   LV E/e' medial:  8.0 LV IVS:        1.10 cm   LV e' lateral:   9.46 cm/s LVOT diam:     2.00 cm   LV E/e' lateral: 5.4 LV SV:         64 LV SV Index:   28 LVOT Area:     3.14 cm  RIGHT VENTRICLE RV Basal diam:  5.80 cm RV Mid diam:    4.30 cm RV S prime:     14.30 cm/s LEFT ATRIUM  Index        RIGHT ATRIUM           Index LA diam:        4.00 cm 1.74 cm/m   RA Area:     25.50 cm LA Vol (A2C):   31.7 ml 13.77 ml/m  RA Volume:   84.00 ml  36.49 ml/m LA Vol (A4C):   29.0  ml 12.60 ml/m LA Biplane Vol: 31.1 ml 13.51 ml/m  AORTIC VALVE AV Area (Vmax):    2.77 cm AV Area (Vmean):   2.47 cm AV Area (VTI):     2.79 cm AV Vmax:           127.00 cm/s AV Vmean:          86.600 cm/s AV VTI:            0.230 m AV Peak Grad:      6.5 mmHg AV Mean Grad:      3.0 mmHg LVOT Vmax:         112.00 cm/s LVOT Vmean:        68.200 cm/s LVOT VTI:          0.204 m LVOT/AV VTI ratio: 0.89  AORTA Ao Root diam: 3.20 cm MITRAL VALVE MV Area (PHT): 2.96 cm    SHUNTS MV Area VTI:   3.22 cm    Systemic VTI:  0.20 m MV Peak grad:  2.3 mmHg    Systemic Diam: 2.00 cm MV Mean grad:  1.0 mmHg MV Vmax:       0.76 m/s MV Vmean:      53.3 cm/s MV Decel Time: 256 msec MV E velocity: 51.40 cm/s MV A velocity: 84.90 cm/s MV E/A ratio:  0.61 Christopher End MD Electronically signed by Lonni Hanson MD Signature Date/Time: 01/25/2024/4:54:00 PM    Final    US  Venous Img Lower Bilateral (DVT) Result Date: 01/25/2024 CLINICAL DATA:  Pulmonary emboli EXAM: BILATERAL LOWER EXTREMITY VENOUS DOPPLER ULTRASOUND TECHNIQUE: Gray-scale sonography with compression, as well as color and duplex ultrasound, were performed to evaluate the deep venous system(s) from the level of the common femoral vein through the popliteal and proximal calf veins. COMPARISON:  CT angiogram performed January 25, 2024 FINDINGS: VENOUS Normal compressibility of the common femoral, superficial femoral, and popliteal veins. Visualized portions of profunda femoral vein and great saphenous vein unremarkable. Nonocclusive thrombus is favored within a posterior tibial vein on the right. This vessel is noncompressible. OTHER None. Limitations: none IMPRESSION: 1. Nonocclusive thrombus is favored within a right posterior tibial vein. These results will be called to the ordering clinician or representative by the Radiologist Assistant, and communication documented in the PACS or [employer]. Electronically Signed   By: Maude Naegeli M.D.   On: 01/25/2024  08:46   CT Angio Chest PE W and/or Wo Contrast Result Date: 01/25/2024 CLINICAL DATA:  Shortness of breath. EXAM: CT ANGIOGRAPHY CHEST WITH CONTRAST TECHNIQUE: Multidetector CT imaging of the chest was performed using the standard protocol during bolus administration of intravenous contrast. Multiplanar CT image reconstructions and MIPs were obtained to evaluate the vascular anatomy. RADIATION DOSE REDUCTION: This exam was performed according to the departmental dose-optimization program which includes automated exposure control, adjustment of the mA and/or kV according to patient size and/or use of iterative reconstruction technique. CONTRAST:  OMNIPAQUE  IOHEXOL  350 MG/ML SOLN COMPARISON:  None Available. FINDINGS: Cardiovascular: Satisfactory opacification of the pulmonary arteries to the segmental level. Marked severity areas of intraluminal low  attenuation are seen involving the bilateral upper lobe, right middle lobe and bilateral lower lobe branches of the pulmonary arteries. Normal heart size with moderate severity coronary artery calcification. Mild right heart strain is noted (RV/LV ratio of 1.04). No pericardial effusion. Mediastinum/Nodes: No enlarged mediastinal, hilar, or axillary lymph nodes. Thyroid gland is not clearly identified. The trachea and esophagus demonstrate no significant findings. Lungs/Pleura: There is mild diffuse interstitial thickening throughout both lungs. Mild atelectatic changes are seen within the posterior aspect of the bilateral lower lobes. No pleural effusion or pneumothorax is identified. Upper Abdomen: No acute abnormality. Musculoskeletal: No chest wall abnormality. No acute or significant osseous findings. Review of the MIP images confirms the above findings. IMPRESSION: 1. Marked severity bilateral pulmonary embolism with mild right heart strain (RV/LV ratio of 1.04). 2. Mild bilateral lower lobe atelectasis. 3. Moderate severity coronary artery calcification.  Electronically Signed   By: Suzen Dials M.D.   On: 01/25/2024 01:56   DG Chest Portable 1 View Result Date: 01/25/2024 EXAM: 1 VIEW XRAY OF THE CHEST 01/25/2024 12:50:00 AM COMPARISON: 04/28/2009 CLINICAL HISTORY: SOB. Patient arrives from home via EMS. Patient complains of shortness of breath and chest pain when taking a deep breath. Patient was diagnosed with pneumonia and given 5 days of antibiotics. Patient reports the last 2 days he has had increased shortness of breath and been using his inhaler more frequently. FINDINGS: LUNGS AND PLEURA: No focal pulmonary opacity. No pulmonary edema. No pleural effusion. No pneumothorax. HEART AND MEDIASTINUM: No acute abnormality of the cardiac and mediastinal silhouettes. BONES AND SOFT TISSUES: No acute osseous abnormality. IMPRESSION: 1. No acute process. Electronically signed by: Pinkie Pebbles MD 01/25/2024 12:55 AM EDT RP Workstation: [id number]      Vernal Alstrom, MD  [facility] 01/26/2024  If 7PM-7AM, please contact night-coverage

## 2024-01-26 NOTE — TOC Progression Note (Signed)
 Transition of Care [facility]) - Progression Note    Patient Details  Name: Jesus Dean MRN: [id number] Date of Birth: 10-15-1960  [facility] CM/SW Contact  Tomasa JAYSON Childes, RN Phone Number: 01/26/2024, 2:54 PM  Clinical Narrative:    TOC continuing to follow patient's progress throughout discharge planning.        Expected Discharge Plan and Services                                               Social Determinants of Health (SDOH) Interventions SDOH Screenings   Food Insecurity: No Food Insecurity (01/25/2024)  Housing: Low Risk  (01/25/2024)  Transportation Needs: No Transportation Needs (01/25/2024)  Utilities: Not At Risk (01/25/2024)  Financial Resource Strain: Low Risk  (07/15/2023)   Received from [facility]  Tobacco Use: Low Risk  (01/25/2024)  Health Literacy: Low Risk  (07/15/2023)   Received from [facility]    Readmission Risk Interventions     No data to display

## 2024-01-26 NOTE — Hospital Course (Signed)
 Jesus Dean is a 63 y.o. male with medical history significant of HTN, hypothyroidism, migraine headache, presented to the hospital with chest pain that started 3 weeks ago pleuritic in nature with worsening pain during deep breaths and coughing with exertional shortness of breath and dyspnea.  He had initially gone to a local hospital at [facility], when he was diagnosed with pneumonia and completed p.o. antibiotic for 5 days.  Afterwards however patient continued to experience pleuritic chest pain and he also noticed increasing of lower extremity swelling.  Patient then presented to the ED.  In the ED patient had a stable blood pressure but needed oxygen 6 L to maintain saturation of 93%.  CTA of the chest was done which was positive for  bilateral PE with mild right heart strain.  DVT study positive for right tibial nonocclusive DVT.    Troponin 52> 49.  Patient was then admitted hospital for further evaluation and treatment.     Acute pulmonary embolism  CTA of the chest showed bilateral PE with mild right heart strain.  Continue IV heparin  drip.   Acute hypoxic respiratory failure secondary to bilateral PE On supplemental oxygen.  Will continue to wean as possible.  Right tibial DVT, unprovoked - Continue with IV heparin .  Plan for Eliquis  on discharge.  Will need outpatient hematology follow-up for hypercoagulable state state workup.     Question of sleep apnea - O2 saturation decreased when patient sleeps with concern about OSA. - Outpatient follow-up with sleep study to rule out OSA   Elevated troponins - Likely from demand ischemia secondary to PE.  No massive or submassive PE on the CTA.  Vital signs stable.  Admitting provider had spoken with ICU attending and no indication for embolectomy.  Continue systemic anticoagulation.  Check 2D echocardiogram.   HTN - Hold off atenolol     Hypothyroidism - Continue Synthroid 

## 2024-01-26 NOTE — Plan of Care (Signed)

## 2024-01-27 DIAGNOSIS — J9601 Acute respiratory failure with hypoxia: Principal | ICD-10-CM | POA: Insufficient documentation

## 2024-01-27 DIAGNOSIS — I2699 Other pulmonary embolism without acute cor pulmonale: Secondary | ICD-10-CM | POA: Diagnosis not present

## 2024-01-27 DIAGNOSIS — E039 Hypothyroidism, unspecified: Secondary | ICD-10-CM

## 2024-01-27 LAB — BASIC METABOLIC PANEL WITH GFR
Anion gap: 8 (ref 5–15)
BUN: 16 mg/dL (ref 8–23)
CO2: 25 mmol/L (ref 22–32)
Calcium: 8.2 mg/dL — ABNORMAL LOW (ref 8.9–10.3)
Chloride: 104 mmol/L (ref 98–111)
Creatinine, Ser: 0.64 mg/dL (ref 0.61–1.24)
GFR, Estimated: >60 mL/min (ref >60)
Glucose, Bld: 193 mg/dL — ABNORMAL HIGH (ref 70–99)
Potassium: 4 mmol/L (ref 3.5–5.1)
Sodium: 137 mmol/L (ref 135–145)

## 2024-01-27 LAB — CBC
HCT: 36.9 % — ABNORMAL LOW (ref 39.0–52.0)
Hemoglobin: 12.9 g/dL — ABNORMAL LOW (ref 13.0–17.0)
MCH: 30.4 pg (ref 26.0–34.0)
MCHC: 35 g/dL (ref 30.0–36.0)
MCV: 87 fL (ref 80.0–100.0)
Platelets: 200 K/uL (ref 150–400)
RBC: 4.24 MIL/uL (ref 4.22–5.81)
RDW: 13 % (ref 11.5–15.5)
WBC: 8.1 K/uL (ref 4.0–10.5)
nRBC: 0 % (ref 0.0–0.2)

## 2024-01-27 LAB — MAGNESIUM: Magnesium: 2 mg/dL (ref 1.7–2.4)

## 2024-01-27 MED ORDER — DILTIAZEM HCL ER COATED BEADS 120 MG PO CP24: 120.0000 mg | ORAL_CAPSULE | Freq: Every day | ORAL | Status: DC | PRN

## 2024-01-27 NOTE — Progress Notes (Signed)
 Progress Note   Patient: Jesus Dean FMW:[id number] DOB: 18-Mar-1961 DOA: 01/25/2024  DOS: the patient was seen and examined on 01/27/2024   Brief hospital course:  63 y.o. male with medical history significant of HTN, hypothyroidism, migraine headache, presented to the hospital with chest pain that started 3 weeks ago pleuritic in nature with worsening pain during deep breaths and coughing with exertional shortness of breath and dyspnea, found to have bilateral PE.  Assessment and Plan:  Acute hypoxic respiratory failure - Initially requiring 6 L nasal cannula, able to be weaned down to 3.  Up to 3.5 this morning.  Still having persistent dyspnea.  Will continue to wean O2 as tolerated.  Acute pulmonary embolism - CT noting bilateral PE with mild right heart strain.  Initially placed on heparin  drip.  No tPA or catheter directed thrombolysis.  Transition to Eliquis  7/22.  Supplemental O2 as above.  May have been worked up in the outpatient setting.  Right tibial DVT - Likely etiology of PE.  Appears to be unprovoked.  Will benefit from hypercoagulable workup from hematology in the outpatient setting after course of anticoagulation.  Elevated troponins - Likely demand ischemia with submassive PE.  Echo noting no RV strain and preserved LV EF.  Hypertension - Holding antihypertensive for now.  Hypothyroidism - Synthroid  on board.    Subjective: Patient resting this morning, states he feels a bit worse than yesterday.  More dyspneic, up to 3.5 L nasal cannula.  Persistent dry cough.  O2 sats appear in the 90s however.  Denies purulent, fever, chills, nausea, vomiting, abdominal pain.  Physical Exam:  Vitals:   01/27/24 0319 01/27/24 0556 01/27/24 0844 01/27/24 1220  BP: 116/74  101/78 117/82  Pulse: (!) 53  (!) 50 (!) 56  Resp: 17  17 20   Temp: 97.6 F (36.4 C)  98 F (36.7 C) 97.7 F (36.5 C)  TempSrc:   Oral   SpO2: 97% 97% 97% 95%  Weight:      Height:         GENERAL:  Alert, pleasant, no acute distress  HEENT:  EOMI, nasal cannula CARDIOVASCULAR:  RRR, no murmurs appreciated RESPIRATORY:  Clear to auscultation, no wheezing, rales, or rhonchi GASTROINTESTINAL:  Soft, nontender, nondistended EXTREMITIES:  No LE edema bilaterally NEURO:  No new focal deficits appreciated SKIN:  No rashes noted PSYCH:  Appropriate mood and affect     Data Reviewed:  Imaging Studies: ECHOCARDIOGRAM COMPLETE Result Date: 01/25/2024    ECHOCARDIOGRAM REPORT   Patient Name:   Jesus Dean Date of Exam: 01/25/2024 Medical Rec #:  [id number]     Height:       72.0 in Accession #:    [id number]    Weight:       240.0 lb Date of Birth:  1961/01/07     BSA:          2.302 m Patient Age:    62 years      BP:           120/75 mmHg Patient Gender: M             HR:           64 bpm. Exam Location:  [facility] Procedure: 2D Echo, Cardiac Doppler and Color Doppler (Both Spectral and Color            Flow Doppler were utilized during procedure). Indications:     Pulmonary embolus I26.09  History:  Patient has no prior history of Echocardiogram examinations.                  Risk Factors:Sleep Apnea. History of migraine, History of                  angina.  Sonographer:     Christopher Furnace Referring Phys:  [id number] CORT ONEIDA MANA Diagnosing Phys: Lonni Hanson MD IMPRESSIONS  1. Left ventricular ejection fraction, by estimation, is 60 to 65%. The left ventricle has normal function. The left ventricle has no regional wall motion abnormalities. Left ventricular diastolic parameters are consistent with Grade I diastolic dysfunction (impaired relaxation).  2. Right ventricular systolic function is low normal. The right ventricular size is moderately enlarged. Tricuspid regurgitation signal is inadequate for assessing PA pressure.  3. Right atrial size was mildly dilated.  4. The mitral valve is normal in structure. No evidence of mitral valve regurgitation. No evidence of mitral stenosis.  5.  The aortic valve has an indeterminant number of cusps. There is mild thickening of the aortic valve. Aortic valve regurgitation is not visualized. Aortic valve sclerosis is present, with no evidence of aortic valve stenosis. FINDINGS  Left Ventricle: Left ventricular ejection fraction, by estimation, is 60 to 65%. The left ventricle has normal function. The left ventricle has no regional wall motion abnormalities. The left ventricular internal cavity size was normal in size. There is  borderline left ventricular hypertrophy. Left ventricular diastolic parameters are consistent with Grade I diastolic dysfunction (impaired relaxation). Right Ventricle: The right ventricular size is moderately enlarged. No increase in right ventricular wall thickness. Right ventricular systolic function is low normal. Tricuspid regurgitation signal is inadequate for assessing PA pressure. Left Atrium: Left atrial size was normal in size. Right Atrium: Right atrial size was mildly dilated. Pericardium: The pericardium was not well visualized. Mitral Valve: The mitral valve is normal in structure. No evidence of mitral valve regurgitation. No evidence of mitral valve stenosis. MV peak gradient, 2.3 mmHg. The mean mitral valve gradient is 1.0 mmHg. Tricuspid Valve: The tricuspid valve is grossly normal. Tricuspid valve regurgitation is trivial. No evidence of tricuspid stenosis. Aortic Valve: The aortic valve has an indeterminant number of cusps. There is mild thickening of the aortic valve. Aortic valve regurgitation is not visualized. Aortic valve sclerosis is present, with no evidence of aortic valve stenosis. Aortic valve mean gradient measures 3.0 mmHg. Aortic valve peak gradient measures 6.5 mmHg. Aortic valve area, by VTI measures 2.79 cm. Pulmonic Valve: The pulmonic valve was not well visualized. Pulmonic valve regurgitation is not visualized. No evidence of pulmonic stenosis. Aorta: The aortic root is normal in size and  structure. Pulmonary Artery: The pulmonary artery is not well seen. IAS/Shunts: The interatrial septum was not well visualized.  LEFT VENTRICLE PLAX 2D LVIDd:         5.30 cm   Diastology LVIDs:         3.80 cm   LV e' medial:    6.42 cm/s LV PW:         1.00 cm   LV E/e' medial:  8.0 LV IVS:        1.10 cm   LV e' lateral:   9.46 cm/s LVOT diam:     2.00 cm   LV E/e' lateral: 5.4 LV SV:         64 LV SV Index:   28 LVOT Area:     3.14 cm  RIGHT VENTRICLE RV Basal  diam:  5.80 cm RV Mid diam:    4.30 cm RV S prime:     14.30 cm/s LEFT ATRIUM             Index        RIGHT ATRIUM           Index LA diam:        4.00 cm 1.74 cm/m   RA Area:     25.50 cm LA Vol (A2C):   31.7 ml 13.77 ml/m  RA Volume:   84.00 ml  36.49 ml/m LA Vol (A4C):   29.0 ml 12.60 ml/m LA Biplane Vol: 31.1 ml 13.51 ml/m  AORTIC VALVE AV Area (Vmax):    2.77 cm AV Area (Vmean):   2.47 cm AV Area (VTI):     2.79 cm AV Vmax:           127.00 cm/s AV Vmean:          86.600 cm/s AV VTI:            0.230 m AV Peak Grad:      6.5 mmHg AV Mean Grad:      3.0 mmHg LVOT Vmax:         112.00 cm/s LVOT Vmean:        68.200 cm/s LVOT VTI:          0.204 m LVOT/AV VTI ratio: 0.89  AORTA Ao Root diam: 3.20 cm MITRAL VALVE MV Area (PHT): 2.96 cm    SHUNTS MV Area VTI:   3.22 cm    Systemic VTI:  0.20 m MV Peak grad:  2.3 mmHg    Systemic Diam: 2.00 cm MV Mean grad:  1.0 mmHg MV Vmax:       0.76 m/s MV Vmean:      53.3 cm/s MV Decel Time: 256 msec MV E velocity: 51.40 cm/s MV A velocity: 84.90 cm/s MV E/A ratio:  0.61 Christopher End MD Electronically signed by Lonni Hanson MD Signature Date/Time: 01/25/2024/4:54:00 PM    Final    US  Venous Img Lower Bilateral (DVT) Result Date: 01/25/2024 CLINICAL DATA:  Pulmonary emboli EXAM: BILATERAL LOWER EXTREMITY VENOUS DOPPLER ULTRASOUND TECHNIQUE: Gray-scale sonography with compression, as well as color and duplex ultrasound, were performed to evaluate the deep venous system(s) from the level of the common  femoral vein through the popliteal and proximal calf veins. COMPARISON:  CT angiogram performed January 25, 2024 FINDINGS: VENOUS Normal compressibility of the common femoral, superficial femoral, and popliteal veins. Visualized portions of profunda femoral vein and great saphenous vein unremarkable. Nonocclusive thrombus is favored within a posterior tibial vein on the right. This vessel is noncompressible. OTHER None. Limitations: none IMPRESSION: 1. Nonocclusive thrombus is favored within a right posterior tibial vein. These results will be called to the ordering clinician or representative by the Radiologist Assistant, and communication documented in the PACS or [employer]. Electronically Signed   By: Maude Naegeli M.D.   On: 01/25/2024 08:46   CT Angio Chest PE W and/or Wo Contrast Result Date: 01/25/2024 CLINICAL DATA:  Shortness of breath. EXAM: CT ANGIOGRAPHY CHEST WITH CONTRAST TECHNIQUE: Multidetector CT imaging of the chest was performed using the standard protocol during bolus administration of intravenous contrast. Multiplanar CT image reconstructions and MIPs were obtained to evaluate the vascular anatomy. RADIATION DOSE REDUCTION: This exam was performed according to the departmental dose-optimization program which includes automated exposure control, adjustment of the mA and/or kV according to patient size and/or use  of iterative reconstruction technique. CONTRAST:  OMNIPAQUE  IOHEXOL  350 MG/ML SOLN COMPARISON:  None Available. FINDINGS: Cardiovascular: Satisfactory opacification of the pulmonary arteries to the segmental level. Marked severity areas of intraluminal low attenuation are seen involving the bilateral upper lobe, right middle lobe and bilateral lower lobe branches of the pulmonary arteries. Normal heart size with moderate severity coronary artery calcification. Mild right heart strain is noted (RV/LV ratio of 1.04). No pericardial effusion. Mediastinum/Nodes: No enlarged  mediastinal, hilar, or axillary lymph nodes. Thyroid gland is not clearly identified. The trachea and esophagus demonstrate no significant findings. Lungs/Pleura: There is mild diffuse interstitial thickening throughout both lungs. Mild atelectatic changes are seen within the posterior aspect of the bilateral lower lobes. No pleural effusion or pneumothorax is identified. Upper Abdomen: No acute abnormality. Musculoskeletal: No chest wall abnormality. No acute or significant osseous findings. Review of the MIP images confirms the above findings. IMPRESSION: 1. Marked severity bilateral pulmonary embolism with mild right heart strain (RV/LV ratio of 1.04). 2. Mild bilateral lower lobe atelectasis. 3. Moderate severity coronary artery calcification. Electronically Signed   By: Suzen Dials M.D.   On: 01/25/2024 01:56   DG Chest Portable 1 View Result Date: 01/25/2024 EXAM: 1 VIEW XRAY OF THE CHEST 01/25/2024 12:50:00 AM COMPARISON: 04/28/2009 CLINICAL HISTORY: SOB. Patient arrives from home via EMS. Patient complains of shortness of breath and chest pain when taking a deep breath. Patient was diagnosed with pneumonia and given 5 days of antibiotics. Patient reports the last 2 days he has had increased shortness of breath and been using his inhaler more frequently. FINDINGS: LUNGS AND PLEURA: No focal pulmonary opacity. No pulmonary edema. No pleural effusion. No pneumothorax. HEART AND MEDIASTINUM: No acute abnormality of the cardiac and mediastinal silhouettes. BONES AND SOFT TISSUES: No acute osseous abnormality. IMPRESSION: 1. No acute process. Electronically signed by: Pinkie Pebbles MD 01/25/2024 12:55 AM EDT RP Workstation: [id number]    There are no new results to review at this time.  Previous records (including but not limited to H&P, progress notes, nursing notes, TOC management) were reviewed in assessment of this patient.  Labs: CBC: Recent Labs  Lab 01/25/24 0104 01/26/24 0416  01/27/24 0207  WBC 10.8* 14.5* 8.1  NEUTROABS 7.9*  --   --   HGB 14.3 13.4 12.9*  HCT 40.7 38.3* 36.9*  MCV 86.0 88.0 87.0  PLT 221 247 200   Basic Metabolic Panel: Recent Labs  Lab 01/25/24 0104 01/26/24 0416 01/27/24 0207  NA 139 138 137  K 4.1 3.9 4.0  CL 102 102 104  CO2 26 26 25   GLUCOSE 191* 201* 193*  BUN 20 16 16   CREATININE 0.76 0.55* 0.64  CALCIUM  8.9 8.7* 8.2*  MG  --   --  2.0   Liver Function Tests: No results for input(s): AST, ALT, ALKPHOS, BILITOT, PROT, ALBUMIN in the last 168 hours. CBG: No results for input(s): GLUCAP in the last 168 hours.  Scheduled Meds:  apixaban   10 mg Oral BID   Followed by   NOREEN ON 02/02/2024] apixaban   5 mg Oral BID   aspirin  EC  81 mg Oral Daily   atorvastatin   10 mg Oral Daily   diltiazem   120 mg Oral Daily   famotidine   20 mg Oral BID   levothyroxine   200 mcg Oral QHS   Continuous Infusions: PRN Meds:.acetaminophen  **OR** acetaminophen , albuterol , diphenhydrAMINE , loperamide , magnesium  hydroxide, ondansetron  **OR** ondansetron  (ZOFRAN ) IV, mouth rinse, traZODone   Family Communication: Son at bedside  Disposition: Status is: Inpatient Remains inpatient appropriate because: Acute complex respiratory failure, PE     Time spent: 35 minutes  Length of inpatient stay: 2 days  Author: Carliss LELON Canales, DO 01/27/2024 1:34 PM  For on call review [email or web address].

## 2024-01-27 NOTE — Progress Notes (Addendum)
 Patient states that he has been living in his car for over a year and is concerned about being D/C' d on o2. Patient also states he is working 3 jobs on most days and does have a heard time paying for medication and food at times. New consult for TOC.

## 2024-01-27 NOTE — Hospital Course (Signed)
 63 y.o. male with medical history significant of HTN, hypothyroidism, migraine headache, presented to the hospital with chest pain that started 3 weeks ago pleuritic in nature with worsening pain during deep breaths and coughing with exertional shortness of breath and dyspnea, found to have bilateral PE.   Assessment and Plan:   Acute hypoxic respiratory failure - Initially requiring 6 L nasal cannula, able to be weaned down to 3.  Up to 3.5 this morning.  Still having persistent dyspnea.  Will continue to wean O2 as tolerated.   Acute pulmonary embolism - CT noting bilateral PE with mild right heart strain.  Initially placed on heparin  drip.  No tPA or catheter directed thrombolysis.  Transition to Eliquis  7/22.  Supplemental O2 as above.  May have been worked up in the outpatient setting.   Right tibial DVT - Likely etiology of PE.  Appears to be unprovoked.  Will benefit from hypercoagulable workup from hematology in the outpatient setting after course of anticoagulation.   Elevated troponins - Likely demand ischemia with submassive PE.  Echo noting no RV strain and preserved LV EF.   Hypertension - Holding antihypertensive for now.   Hypothyroidism - Synthroid  on board.

## 2024-01-27 NOTE — Plan of Care (Signed)
  Problem: Education: Goal: Knowledge of General Education information will improve Description: Including pain rating scale, medication(s)/side effects and non-pharmacologic comfort measures Outcome: Progressing   Problem: Clinical Measurements: Goal: Ability to maintain clinical measurements within normal limits will improve Outcome: Progressing   Problem: Clinical Measurements: Goal: Diagnostic test results will improve Outcome: Progressing   Problem: Clinical Measurements: Goal: Respiratory complications will improve Outcome: Progressing   Problem: Clinical Measurements: Goal: Cardiovascular complication will be avoided Outcome: Progressing   Problem: Activity: Goal: Risk for activity intolerance will decrease Outcome: Progressing

## 2024-01-28 ENCOUNTER — Other Ambulatory Visit: Payer: Self-pay

## 2024-01-28 DIAGNOSIS — J9601 Acute respiratory failure with hypoxia: Secondary | ICD-10-CM | POA: Diagnosis not present

## 2024-01-28 DIAGNOSIS — I2699 Other pulmonary embolism without acute cor pulmonale: Secondary | ICD-10-CM | POA: Diagnosis not present

## 2024-01-28 DIAGNOSIS — E039 Hypothyroidism, unspecified: Secondary | ICD-10-CM | POA: Diagnosis not present

## 2024-01-28 LAB — BASIC METABOLIC PANEL WITH GFR
Anion gap: 12 (ref 5–15)
BUN: 16 mg/dL (ref 8–23)
CO2: 26 mmol/L (ref 22–32)
Calcium: 8.7 mg/dL — ABNORMAL LOW (ref 8.9–10.3)
Chloride: 101 mmol/L (ref 98–111)
Creatinine, Ser: 0.6 mg/dL — ABNORMAL LOW (ref 0.61–1.24)
GFR, Estimated: >60 mL/min (ref >60)
Glucose, Bld: 153 mg/dL — ABNORMAL HIGH (ref 70–99)
Potassium: 3.9 mmol/L (ref 3.5–5.1)
Sodium: 139 mmol/L (ref 135–145)

## 2024-01-28 LAB — CBC
HCT: 40.5 % (ref 39.0–52.0)
Hemoglobin: 14 g/dL (ref 13.0–17.0)
MCH: 30 pg (ref 26.0–34.0)
MCHC: 34.6 g/dL (ref 30.0–36.0)
MCV: 86.9 fL (ref 80.0–100.0)
Platelets: 238 K/uL (ref 150–400)
RBC: 4.66 MIL/uL (ref 4.22–5.81)
RDW: 12.7 % (ref 11.5–15.5)
WBC: 8.9 K/uL (ref 4.0–10.5)
nRBC: 0 % (ref 0.0–0.2)

## 2024-01-28 MED ORDER — APIXABAN 5 MG PO TABS
ORAL_TABLET | ORAL | 3 refills | Status: AC
  Filled 2024-01-28: qty 70, 30d supply, fill #0

## 2024-01-28 NOTE — Plan of Care (Signed)
  Problem: Education: Goal: Knowledge of General Education information will improve Description: Including pain rating scale, medication(s)/side effects and non-pharmacologic comfort measures Outcome: Progressing   Problem: Clinical Measurements: Goal: Will remain free from infection Outcome: Progressing Goal: Respiratory complications will improve Outcome: Progressing   Problem: Activity: Goal: Risk for activity intolerance will decrease Outcome: Progressing   Problem: Nutrition: Goal: Adequate nutrition will be maintained Outcome: Progressing   Problem: Pain Managment: Goal: General experience of comfort will improve and/or be controlled Outcome: Progressing   Problem: Safety: Goal: Ability to remain free from injury will improve Outcome: Progressing   Problem: Skin Integrity: Goal: Risk for impaired skin integrity will decrease Outcome: Progressing

## 2024-01-28 NOTE — Discharge Summary (Signed)
 Physician Discharge Summary   Patient: Jesus Dean MRN: [id number] DOB: August 19, 1960  Admit date:     01/25/2024  Discharge date: 01/28/24  Discharge Physician: Carliss LELON Canales   PCP: Kristie Norleen BIRCH, MD   Recommendations at discharge:    Pt to be discharged home.   If you experience worsening fever, chills, chest pain, shortness of breath, or other concerning symptoms, please call your PCP or go to the emergency department immediately.  Discharge Diagnoses: Principal Problem:   Acute pulmonary embolism (HCC) Active Problems:   Hypothyroidism, unspecified   Acute respiratory failure with hypoxia (HCC)  Resolved Problems:   * No resolved hospital problems. *   Hospital Course:  63 y.o. male with medical history significant of HTN, hypothyroidism, migraine headache, presented to the hospital with chest pain that started 3 weeks ago pleuritic in nature with worsening pain during deep breaths and coughing with exertional shortness of breath and dyspnea, found to have bilateral PE.   Assessment and Plan:   Acute hypoxic respiratory failure - Initially requiring 6 L nasal cannula, able to be weaned down to room air throughout the course of hospital stay.   Acute pulmonary embolism - CT noting bilateral PE with mild right heart strain.  Initially placed on heparin  drip.  No tPA or catheter directed thrombolysis.  Transition to Eliquis  7/22.  Was able to be weaned off of supplemental O2 down to room air.  Patient to take Eliquis  10 mg twice daily for 7 days (through 7/28) then transition to 5 mg twice daily thereafter.  Recommend follow-up with PCP in the outpatient setting.  Right tibial DVT - Likely etiology of PE.  Appears to be unprovoked.  Will benefit from hypercoagulable workup from hematology in the outpatient setting after course of anticoagulation.   Elevated troponins - Likely demand ischemia with submassive PE.  Echo noting no RV strain and preserved LV EF.  Currently on  anticoagulation.   Hypertension - Can resume home medication regimen   Hypothyroidism - Synthroid  on board.   Consultants: None Procedures performed: None Disposition: Home Diet recommendation:  Discharge Diet Orders (From admission, onward)     Start     Ordered   01/28/24 0000  Diet - low sodium heart healthy        01/28/24 1334           Cardiac diet  DISCHARGE MEDICATION: Allergies as of 01/28/2024   No Known Allergies      Medication List     STOP taking these medications    acetaminophen  500 MG tablet Commonly known as: TYLENOL    atenolol  25 MG tablet Commonly known as: TENORMIN    diltiazem  120 MG 24 hr capsule Commonly known as: CARDIZEM  CD   EPINEPHrine  0.3 mg/0.3 mL Soaj injection Commonly known as: EPI-PEN   famotidine  20 MG tablet Commonly known as: PEPCID    ibuprofen  600 MG tablet Commonly known as: ADVIL    predniSONE  10 MG (21) Tbpk tablet Commonly known as: STERAPRED UNI-PAK 21 TAB       TAKE these medications    albuterol  108 (90 Base) MCG/ACT inhaler Commonly known as: VENTOLIN  HFA Inhale 2 puffs into the lungs every 6 (six) hours as needed for wheezing or shortness of breath.   apixaban  5 MG Tabs tablet Commonly known as: ELIQUIS  Take 2 tablets (10 mg total) by mouth 2 (two) times daily for 5 days, THEN 1 tablet (5 mg total) 2 (two) times daily. Start taking on: January 28, 2024  atorvastatin  20 MG tablet Commonly known as: LIPITOR Take 10 mg by mouth daily.   diphenhydrAMINE  25 mg capsule Commonly known as: Benadryl  Allergy Take 1 capsule (25 mg total) by mouth every 6 (six) hours as needed for up to 5 days.   levothyroxine  200 MCG tablet Commonly known as: SYNTHROID  Take 200 mcg by mouth at bedtime.   naproxen sodium 220 MG tablet Commonly known as: ALEVE Take 220 mg by mouth as needed.   omeprazole 20 MG capsule Commonly known as: PRILOSEC Take 20 mg by mouth daily.        Follow-up Information      Jacobo Evalene PARAS, MD Follow up in 1 month(s).   Specialty: Oncology Why: PE, DVT Contact information: [address] [place] [address] [phone]                 Discharge Exam: Filed Weights   01/25/24 0035  Weight: 108.9 kg    GENERAL:  Alert, pleasant, no acute distress  HEENT:  EOMI CARDIOVASCULAR:  RRR, no murmurs appreciated RESPIRATORY:  Clear to auscultation, no wheezing, rales, or rhonchi GASTROINTESTINAL:  Soft, nontender, nondistended EXTREMITIES:  No LE edema bilaterally NEURO:  No new focal deficits appreciated SKIN:  No rashes noted PSYCH:  Appropriate mood and affect     Condition at discharge: improving  The results of significant diagnostics from this hospitalization (including imaging, microbiology, ancillary and laboratory) are listed below for reference.   Imaging Studies: ECHOCARDIOGRAM COMPLETE Result Date: 01/25/2024    ECHOCARDIOGRAM REPORT   Patient Name:   Jesus Dean Date of Exam: 01/25/2024 Medical Rec #:  [id number]     Height:       72.0 in Accession #:    [id number]    Weight:       240.0 lb Date of Birth:  [birth year]     BSA:          2.302 m Patient Age:    62 years      BP:           120/75 mmHg Patient Gender: M             HR:           64 bpm. Exam Location:  [facility] Procedure: 2D Echo, Cardiac Doppler and Color Doppler (Both Spectral and Color            Flow Doppler were utilized during procedure). Indications:     Pulmonary embolus I26.09  History:         Patient has no prior history of Echocardiogram examinations.                  Risk Factors:Sleep Apnea. History of migraine, History of                  angina.  Sonographer:     Christopher Furnace Referring Phys:  [id number] CORT ONEIDA MANA Diagnosing Phys: Lonni Hanson MD IMPRESSIONS  1. Left ventricular ejection fraction, by estimation, is 60 to 65%. The left ventricle has normal function. The left ventricle has no regional wall motion abnormalities. Left ventricular diastolic  parameters are consistent with Grade I diastolic dysfunction (impaired relaxation).  2. Right ventricular systolic function is low normal. The right ventricular size is moderately enlarged. Tricuspid regurgitation signal is inadequate for assessing PA pressure.  3. Right atrial size was mildly dilated.  4. The mitral valve is normal in structure. No evidence of mitral valve regurgitation. No  evidence of mitral stenosis.  5. The aortic valve has an indeterminant number of cusps. There is mild thickening of the aortic valve. Aortic valve regurgitation is not visualized. Aortic valve sclerosis is present, with no evidence of aortic valve stenosis. FINDINGS  Left Ventricle: Left ventricular ejection fraction, by estimation, is 60 to 65%. The left ventricle has normal function. The left ventricle has no regional wall motion abnormalities. The left ventricular internal cavity size was normal in size. There is  borderline left ventricular hypertrophy. Left ventricular diastolic parameters are consistent with Grade I diastolic dysfunction (impaired relaxation). Right Ventricle: The right ventricular size is moderately enlarged. No increase in right ventricular wall thickness. Right ventricular systolic function is low normal. Tricuspid regurgitation signal is inadequate for assessing PA pressure. Left Atrium: Left atrial size was normal in size. Right Atrium: Right atrial size was mildly dilated. Pericardium: The pericardium was not well visualized. Mitral Valve: The mitral valve is normal in structure. No evidence of mitral valve regurgitation. No evidence of mitral valve stenosis. MV peak gradient, 2.3 mmHg. The mean mitral valve gradient is 1.0 mmHg. Tricuspid Valve: The tricuspid valve is grossly normal. Tricuspid valve regurgitation is trivial. No evidence of tricuspid stenosis. Aortic Valve: The aortic valve has an indeterminant number of cusps. There is mild thickening of the aortic valve. Aortic valve regurgitation  is not visualized. Aortic valve sclerosis is present, with no evidence of aortic valve stenosis. Aortic valve mean gradient measures 3.0 mmHg. Aortic valve peak gradient measures 6.5 mmHg. Aortic valve area, by VTI measures 2.79 cm. Pulmonic Valve: The pulmonic valve was not well visualized. Pulmonic valve regurgitation is not visualized. No evidence of pulmonic stenosis. Aorta: The aortic root is normal in size and structure. Pulmonary Artery: The pulmonary artery is not well seen. IAS/Shunts: The interatrial septum was not well visualized.  LEFT VENTRICLE PLAX 2D LVIDd:         5.30 cm   Diastology LVIDs:         3.80 cm   LV e' medial:    6.42 cm/s LV PW:         1.00 cm   LV E/e' medial:  8.0 LV IVS:        1.10 cm   LV e' lateral:   9.46 cm/s LVOT diam:     2.00 cm   LV E/e' lateral: 5.4 LV SV:         64 LV SV Index:   28 LVOT Area:     3.14 cm  RIGHT VENTRICLE RV Basal diam:  5.80 cm RV Mid diam:    4.30 cm RV S prime:     14.30 cm/s LEFT ATRIUM             Index        RIGHT ATRIUM           Index LA diam:        4.00 cm 1.74 cm/m   RA Area:     25.50 cm LA Vol (A2C):   31.7 ml 13.77 ml/m  RA Volume:   84.00 ml  36.49 ml/m LA Vol (A4C):   29.0 ml 12.60 ml/m LA Biplane Vol: 31.1 ml 13.51 ml/m  AORTIC VALVE AV Area (Vmax):    2.77 cm AV Area (Vmean):   2.47 cm AV Area (VTI):     2.79 cm AV Vmax:           127.00 cm/s AV Vmean:  86.600 cm/s AV VTI:            0.230 m AV Peak Grad:      6.5 mmHg AV Mean Grad:      3.0 mmHg LVOT Vmax:         112.00 cm/s LVOT Vmean:        68.200 cm/s LVOT VTI:          0.204 m LVOT/AV VTI ratio: 0.89  AORTA Ao Root diam: 3.20 cm MITRAL VALVE MV Area (PHT): 2.96 cm    SHUNTS MV Area VTI:   3.22 cm    Systemic VTI:  0.20 m MV Peak grad:  2.3 mmHg    Systemic Diam: 2.00 cm MV Mean grad:  1.0 mmHg MV Vmax:       0.76 m/s MV Vmean:      53.3 cm/s MV Decel Time: 256 msec MV E velocity: 51.40 cm/s MV A velocity: 84.90 cm/s MV E/A ratio:  0.61 Christopher End MD  Electronically signed by Lonni Hanson MD Signature Date/Time: 01/25/2024/4:54:00 PM    Final    US  Venous Img Lower Bilateral (DVT) Result Date: 01/25/2024 CLINICAL DATA:  Pulmonary emboli EXAM: BILATERAL LOWER EXTREMITY VENOUS DOPPLER ULTRASOUND TECHNIQUE: Gray-scale sonography with compression, as well as color and duplex ultrasound, were performed to evaluate the deep venous system(s) from the level of the common femoral vein through the popliteal and proximal calf veins. COMPARISON:  CT angiogram performed January 25, 2024 FINDINGS: VENOUS Normal compressibility of the common femoral, superficial femoral, and popliteal veins. Visualized portions of profunda femoral vein and great saphenous vein unremarkable. Nonocclusive thrombus is favored within a posterior tibial vein on the right. This vessel is noncompressible. OTHER None. Limitations: none IMPRESSION: 1. Nonocclusive thrombus is favored within a right posterior tibial vein. These results will be called to the ordering clinician or representative by the Radiologist Assistant, and communication documented in the PACS or [employer]. Electronically Signed   By: Maude Naegeli M.D.   On: 01/25/2024 08:46   CT Angio Chest PE W and/or Wo Contrast Result Date: 01/25/2024 CLINICAL DATA:  Shortness of breath. EXAM: CT ANGIOGRAPHY CHEST WITH CONTRAST TECHNIQUE: Multidetector CT imaging of the chest was performed using the standard protocol during bolus administration of intravenous contrast. Multiplanar CT image reconstructions and MIPs were obtained to evaluate the vascular anatomy. RADIATION DOSE REDUCTION: This exam was performed according to the departmental dose-optimization program which includes automated exposure control, adjustment of the mA and/or kV according to patient size and/or use of iterative reconstruction technique. CONTRAST:  OMNIPAQUE  IOHEXOL  350 MG/ML SOLN COMPARISON:  None Available. FINDINGS: Cardiovascular: Satisfactory  opacification of the pulmonary arteries to the segmental level. Marked severity areas of intraluminal low attenuation are seen involving the bilateral upper lobe, right middle lobe and bilateral lower lobe branches of the pulmonary arteries. Normal heart size with moderate severity coronary artery calcification. Mild right heart strain is noted (RV/LV ratio of 1.04). No pericardial effusion. Mediastinum/Nodes: No enlarged mediastinal, hilar, or axillary lymph nodes. Thyroid gland is not clearly identified. The trachea and esophagus demonstrate no significant findings. Lungs/Pleura: There is mild diffuse interstitial thickening throughout both lungs. Mild atelectatic changes are seen within the posterior aspect of the bilateral lower lobes. No pleural effusion or pneumothorax is identified. Upper Abdomen: No acute abnormality. Musculoskeletal: No chest wall abnormality. No acute or significant osseous findings. Review of the MIP images confirms the above findings. IMPRESSION: 1. Marked severity bilateral pulmonary embolism with mild right  heart strain (RV/LV ratio of 1.04). 2. Mild bilateral lower lobe atelectasis. 3. Moderate severity coronary artery calcification. Electronically Signed   By: Suzen Dials M.D.   On: 01/25/2024 01:56   DG Chest Portable 1 View Result Date: 01/25/2024 EXAM: 1 VIEW XRAY OF THE CHEST 01/25/2024 12:50:00 AM COMPARISON: 04/28/2009 CLINICAL HISTORY: SOB. Patient arrives from home via EMS. Patient complains of shortness of breath and chest pain when taking a deep breath. Patient was diagnosed with pneumonia and given 5 days of antibiotics. Patient reports the last 2 days he has had increased shortness of breath and been using his inhaler more frequently. FINDINGS: LUNGS AND PLEURA: No focal pulmonary opacity. No pulmonary edema. No pleural effusion. No pneumothorax. HEART AND MEDIASTINUM: No acute abnormality of the cardiac and mediastinal silhouettes. BONES AND SOFT TISSUES: No  acute osseous abnormality. IMPRESSION: 1. No acute process. Electronically signed by: Pinkie Pebbles MD 01/25/2024 12:55 AM EDT RP Workstation: [id number]    Microbiology: Results for orders placed or performed during the hospital encounter of 01/25/24  Resp panel by RT-PCR (RSV, Flu A&B, Covid) Anterior Nasal Swab     Status: None   Collection Time: 01/25/24  1:04 AM   Specimen: Anterior Nasal Swab  Result Value Ref Range Status   SARS Coronavirus 2 by RT PCR NEGATIVE NEGATIVE Final    Comment: (NOTE) SARS-CoV-2 target nucleic acids are NOT DETECTED.  The SARS-CoV-2 RNA is generally detectable in upper respiratory specimens during the acute phase of infection. The lowest concentration of SARS-CoV-2 viral copies this assay can detect is 138 copies/mL. A negative result does not preclude SARS-Cov-2 infection and should not be used as the sole basis for treatment or other patient management decisions. A negative result may occur with  improper specimen collection/handling, submission of specimen other than nasopharyngeal swab, presence of viral mutation(s) within the areas targeted by this assay, and inadequate number of viral copies(<138 copies/mL). A negative result must be combined with clinical observations, patient history, and epidemiological information. The expected result is Negative.  Fact Sheet for Patients:  BloggerCourse.com  Fact Sheet for Healthcare Providers:  SeriousBroker.it  This test is no t yet approved or cleared by the [place]  FDA and  has been authorized for detection and/or diagnosis of SARS-CoV-2 by FDA under an Emergency Use Authorization (EUA). This EUA will remain  in effect (meaning this test can be used) for the duration of the COVID-19 declaration under Section 564(b)(1) of the Act, 21 U.S.C.section 360bbb-3(b)(1), unless the authorization is terminated  or revoked sooner.       Influenza  A by PCR NEGATIVE NEGATIVE Final   Influenza B by PCR NEGATIVE NEGATIVE Final    Comment: (NOTE) The Xpert Xpress SARS-CoV-2/FLU/RSV plus assay is intended as an aid in the diagnosis of influenza from Nasopharyngeal swab specimens and should not be used as a sole basis for treatment. Nasal washings and aspirates are unacceptable for Xpert Xpress SARS-CoV-2/FLU/RSV testing.  Fact Sheet for Patients: BloggerCourse.com  Fact Sheet for Healthcare Providers: SeriousBroker.it  This test is not yet approved or cleared by the [place]  FDA and has been authorized for detection and/or diagnosis of SARS-CoV-2 by FDA under an Emergency Use Authorization (EUA). This EUA will remain in effect (meaning this test can be used) for the duration of the COVID-19 declaration under Section 564(b)(1) of the Act, 21 U.S.C. section 360bbb-3(b)(1), unless the authorization is terminated or revoked.     Resp Syncytial Virus by PCR NEGATIVE NEGATIVE Final  Comment: (NOTE) Fact Sheet for Patients: BloggerCourse.com  Fact Sheet for Healthcare Providers: SeriousBroker.it  This test is not yet approved or cleared by the [place]  FDA and has been authorized for detection and/or diagnosis of SARS-CoV-2 by FDA under an Emergency Use Authorization (EUA). This EUA will remain in effect (meaning this test can be used) for the duration of the COVID-19 declaration under Section 564(b)(1) of the Act, 21 U.S.C. section 360bbb-3(b)(1), unless the authorization is terminated or revoked.  Performed at [facility], [address] [place], [place] [address]   Blood culture (single)     Status: None (Preliminary result)   Collection Time: 01/25/24  1:04 AM   Specimen: BLOOD  Result Value Ref Range Status   Specimen Description BLOOD RIGHT [facility]  Final   Special Requests   Final    BOTTLES DRAWN  AEROBIC AND ANAEROBIC Blood Culture adequate volume   Culture   Final    NO GROWTH 3 DAYS Performed at [facility], [address] [place], [place] [address]    Report Status PENDING  Incomplete    Labs: CBC: Recent Labs  Lab 01/25/24 0104 01/26/24 0416 01/27/24 0207 01/28/24 0242  WBC 10.8* 14.5* 8.1 8.9  NEUTROABS 7.9*  --   --   --   HGB 14.3 13.4 12.9* 14.0  HCT 40.7 38.3* 36.9* 40.5  MCV 86.0 88.0 87.0 86.9  PLT 221 247 200 238   Basic Metabolic Panel: Recent Labs  Lab 01/25/24 0104 01/26/24 0416 01/27/24 0207 01/28/24 0242  NA 139 138 137 139  K 4.1 3.9 4.0 3.9  CL 102 102 104 101  CO2 26 26 25 26   GLUCOSE 191* 201* 193* 153*  BUN 20 16 16 16   CREATININE 0.76 0.55* 0.64 0.60*  CALCIUM  8.9 8.7* 8.2* 8.7*  MG  --   --  2.0  --    Liver Function Tests: No results for input(s): AST, ALT, ALKPHOS, BILITOT, PROT, ALBUMIN in the last 168 hours. CBG: No results for input(s): GLUCAP in the last 168 hours.  Discharge time spent: 39 minutes.  Length of inpatient stay: 3 days  Signed: Carliss LELON Canales, DO [facility] 01/28/2024

## 2024-01-30 LAB — CULTURE, BLOOD (SINGLE)
Culture: NO GROWTH
Special Requests: ADEQUATE

## 2024-02-12 NOTE — Assessment & Plan Note (Signed)
 Trial of TMC cream twice a day.  Script sent.
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
## 2024-02-29 ENCOUNTER — Inpatient Hospital Stay

## 2024-02-29 ENCOUNTER — Inpatient Hospital Stay: Attending: Oncology | Admitting: Oncology

## 2024-02-29 ENCOUNTER — Encounter: Payer: Self-pay | Admitting: Oncology

## 2024-02-29 VITALS — BP 114/89 | HR 73 | Temp 97.8°F | Resp 18 | Ht 72.0 in | Wt 233.0 lb

## 2024-02-29 DIAGNOSIS — Z801 Family history of malignant neoplasm of trachea, bronchus and lung: Secondary | ICD-10-CM | POA: Diagnosis not present

## 2024-02-29 DIAGNOSIS — I2699 Other pulmonary embolism without acute cor pulmonale: Secondary | ICD-10-CM

## 2024-02-29 DIAGNOSIS — Z800 Family history of malignant neoplasm of digestive organs: Secondary | ICD-10-CM | POA: Diagnosis not present

## 2024-02-29 DIAGNOSIS — I82401 Acute embolism and thrombosis of unspecified deep veins of right lower extremity: Secondary | ICD-10-CM | POA: Insufficient documentation

## 2024-02-29 DIAGNOSIS — Z8042 Family history of malignant neoplasm of prostate: Secondary | ICD-10-CM | POA: Insufficient documentation

## 2024-02-29 DIAGNOSIS — Z808 Family history of malignant neoplasm of other organs or systems: Secondary | ICD-10-CM | POA: Insufficient documentation

## 2024-02-29 DIAGNOSIS — Z79899 Other long term (current) drug therapy: Secondary | ICD-10-CM | POA: Insufficient documentation

## 2024-02-29 LAB — ANTITHROMBIN III: AntiThromb III Func: 109 % (ref 75–120)

## 2024-02-29 NOTE — Progress Notes (Unsigned)
 [facility]  Telephone:(336) [phone] Fax:(336) [phone]  ID: Sindy LITTIE Jules OB: 1961-05-07  MR#: [id number]  RDW#:[id number]  Patient Care Team: Kristie Norleen BIRCH, MD (Inactive) as PCP - General (Neurology)  CHIEF COMPLAINT: Bilateral pulmonary embolism and right lower leg DVT.  INTERVAL HISTORY: Patient is a 63 year old male who presented to the emergency room approximately 1 month ago with pleuritic chest pain and increasing exertional dyspnea.  Subsequent workup revealed bilateral pulmonary embolism with heart strain as well as a right lower leg DVT.  He was subsequently placed on Eliquis  and is referred for further evaluation.  He currently feels well and is back to his baseline.  He is tolerating Eliquis  without significant side effects.  He has no neurological plaints.  He denies any recent fevers or illnesses.  He has a good appetite and denies weight loss.  He has no chest pain, shortness of breath, cough, or hemoptysis.  He denies any nausea, vomiting, constipation, or diarrhea.  He has no melena or hematochezia.  He has no urinary complaints.  Patient offers no further specific complaints today.  REVIEW OF SYSTEMS:   Review of Systems  Constitutional: Negative.  Negative for fever, malaise/fatigue and weight loss.  Respiratory: Negative.  Negative for cough, hemoptysis and shortness of breath.   Cardiovascular: Negative.  Negative for chest pain and leg swelling.  Gastrointestinal: Negative.  Negative for abdominal pain.  Genitourinary: Negative.  Negative for dysuria.  Musculoskeletal: Negative.  Negative for back pain.  Skin: Negative.  Negative for rash.  Neurological: Negative.  Negative for dizziness, focal weakness, weakness and headaches.  Psychiatric/Behavioral: Negative.  The patient is not nervous/anxious.     As per HPI. Otherwise, a complete review of systems is negative.  PAST MEDICAL HISTORY: Past Medical History:  Diagnosis Date   Asthma    GERD  (gastroesophageal reflux disease)    OCC   History of angina    History of migraine headaches    Hypothyroidism    Hypothyroidism, unspecified 12/10/2016   Migraine headache 12/10/2016   Neuropathy 09/11/2015   Last Assessment & Plan:  Denies any new complaints.     Peyronie disease    Rectal bleeding    Routine history and physical examination of adult 04/04/2015   Overview:  Prevnar 2016.  Last Assessment & Plan:  He is agreeable to colonoscopy.   Ordered colonoscopy. Check labs today.   Sleep apnea    DOES NOT HAVE CPAP-HAD THE 1ST PART OF THE TEST DONE AND DID NOT GO BACK FOR THE 2ND PART    PAST SURGICAL HISTORY: Past Surgical History:  Procedure Laterality Date   hernia Left    HERNIA REPAIR Right    INGUINAL HERNIA REPAIR Left 06/23/2017   Procedure: LAPAROSCOPIC INGUINAL HERNIA;  Surgeon: Desiderio Schanz, MD;  Location: [facility] ORS;  Service: General;  Laterality: Left;   INNER EAR SURGERY     MULTIPLE SURGERIES   MYRINGOTOMY WITH TUBE PLACEMENT Bilateral 08/23/2020   Procedure: MYRINGOTOMY WITH TUBE PLACEMENT;  Surgeon: Juengel, Paul, MD;  Location: [facility];  Service: ENT;  Laterality: Bilateral;  COVID positive 07/25/20   THYROIDECTOMY     TONSILLECTOMY AND ADENOIDECTOMY     VASECTOMY      FAMILY HISTORY: Family History  Problem Relation Age of Onset   COPD Father    Cancer Father        Colon   Lung cancer Father    Prostate cancer Father    Hypothyroidism Father  COPD Mother    Hypertension Mother    Hypothyroidism Mother    Brain cancer Sister     ADVANCED DIRECTIVES (Y/N):  N  HEALTH MAINTENANCE: Social History   Tobacco Use   Smoking status: Never   Smokeless tobacco: Never  Vaping Use   Vaping status: Never Used  Substance Use Topics   Alcohol use: No   Drug use: No     Colonoscopy:  PAP:  Bone density:  Lipid panel:  No Known Allergies  Current Outpatient Medications  Medication Sig Dispense Refill   albuterol  (PROVENTIL   HFA;VENTOLIN  HFA) 108 (90 Base) MCG/ACT inhaler Inhale 2 puffs into the lungs every 6 (six) hours as needed for wheezing or shortness of breath.      apixaban  (ELIQUIS ) 5 MG TABS tablet Take 2 tablets (10 mg total) by mouth 2 (two) times daily for 5 days, THEN 1 tablet (5 mg total) 2 (two) times daily. 60 tablet 3   atorvastatin  (LIPITOR) 20 MG tablet Take 10 mg by mouth daily.     diphenhydrAMINE  (BENADRYL  ALLERGY) 25 mg capsule Take 1 capsule (25 mg total) by mouth every 6 (six) hours as needed for up to 5 days. 30 capsule 0   levothyroxine  (SYNTHROID , LEVOTHROID) 200 MCG tablet Take 200 mcg by mouth at bedtime.      omeprazole (PRILOSEC) 20 MG capsule Take 20 mg by mouth daily.     naproxen sodium (ALEVE) 220 MG tablet Take 220 mg by mouth as needed. (Patient not taking: Reported on 02/29/2024)     No current facility-administered medications for this visit.    OBJECTIVE: Vitals:   02/29/24 1132  BP: 114/89  Pulse: 73  Resp: 18  Temp: 97.8 F (36.6 C)  SpO2: 96%     Body mass index is 31.6 kg/m.    ECOG FS:0 - Asymptomatic  General: Well-developed, well-nourished, no acute distress. Eyes: Pink conjunctiva, anicteric sclera. HEENT: Normocephalic, moist mucous membranes. Lungs: No audible wheezing or coughing. Heart: Regular rate and rhythm. Abdomen: Soft, nontender, no obvious distention. Musculoskeletal: No edema, cyanosis, or clubbing. Neuro: Alert, answering all questions appropriately. Cranial nerves grossly intact. Skin: No rashes or petechiae noted. Psych: Normal affect. Lymphatics: No cervical, calvicular, axillary or inguinal LAD.   LAB RESULTS:  Lab Results  Component Value Date   NA 139 01/28/2024   K 3.9 01/28/2024   CL 101 01/28/2024   CO2 26 01/28/2024   GLUCOSE 153 (H) 01/28/2024   BUN 16 01/28/2024   CREATININE 0.60 (L) 01/28/2024   CALCIUM  8.7 (L) 01/28/2024   PROT 5.0 (L) 02/06/2021   ALBUMIN 3.2 (L) 02/06/2021   AST 15 02/06/2021   ALT 22  02/06/2021   ALKPHOS 39 02/06/2021   BILITOT 0.9 02/06/2021   GFRNONAA >60 01/28/2024   GFRAA >60 01/09/2012    Lab Results  Component Value Date   WBC 8.9 01/28/2024   NEUTROABS 7.9 (H) 01/25/2024   HGB 14.0 01/28/2024   HCT 40.5 01/28/2024   MCV 86.9 01/28/2024   PLT 238 01/28/2024     STUDIES: No results found.  ASSESSMENT: Bilateral pulmonary embolism and right lower leg DVT.  PLAN:    Bilateral pulmonary embolism and right lower leg DVT: Diagnosed on January 25, 2024 with CT scan of the chest and lower extremity Doppler.  Patient reports he was on a car ride to [facility], [place]  the week prior to his symptoms but no other obvious transient risk factors.  Will do full hypercoagulable workup today  for completeness.  Patient is instructed to continue Eliquis  for minimum of 6 months.  No further intervention is needed at this time.  Return to clinic in 1 month for further evaluation and discussion of his laboratory results.  I spent a total of 45 minutes reviewing chart data, face-to-face evaluation with the patient, counseling and coordination of care as detailed above.   Patient expressed understanding and was in agreement with this plan. He also understands that He can call clinic at any time with any questions, concerns, or complaints.    Evalene JINNY Reusing, MD   03/01/2024 12:00 PM

## 2024-02-29 NOTE — Progress Notes (Unsigned)
 Patient is having some shortness of breath, and some fatigue.

## 2024-03-02 LAB — PROTEIN S ACTIVITY: Protein S Activity: 86 % (ref 63–140)

## 2024-03-02 LAB — PROTEIN C ACTIVITY: Protein C Activity: 98 % (ref 73–180)

## 2024-03-02 LAB — PROTEIN S, TOTAL: Protein S Ag, Total: 70 % (ref 60–150)

## 2024-03-02 LAB — LUPUS ANTICOAGULANT PANEL
DRVVT: 36.4 s (ref 0.0–47.0)
PTT Lupus Anticoagulant: 40.6 s (ref 0.0–43.5)

## 2024-03-02 LAB — CARDIOLIPIN ANTIBODIES, IGG, IGM, IGA
Anticardiolipin IgA: <9 U/mL (ref 0–11)
Anticardiolipin IgG: <9 GPL U/mL (ref 0–14)
Anticardiolipin IgM: 10 [MPL'U]/mL (ref 0–12)

## 2024-03-04 LAB — PROTEIN C, TOTAL: Protein C, Total: 79 % (ref 60–150)

## 2024-03-08 LAB — FACTOR 5 LEIDEN

## 2024-03-08 LAB — PROTHROMBIN GENE MUTATION

## 2024-03-31 ENCOUNTER — Inpatient Hospital Stay: Attending: Oncology | Admitting: Oncology

## 2024-03-31 ENCOUNTER — Encounter: Payer: Self-pay | Admitting: Oncology

## 2024-03-31 VITALS — BP 123/75 | HR 68 | Temp 98.8°F | Resp 18 | Ht 72.0 in | Wt 237.0 lb

## 2024-03-31 DIAGNOSIS — Z808 Family history of malignant neoplasm of other organs or systems: Secondary | ICD-10-CM | POA: Diagnosis not present

## 2024-03-31 DIAGNOSIS — Z8042 Family history of malignant neoplasm of prostate: Secondary | ICD-10-CM | POA: Diagnosis not present

## 2024-03-31 DIAGNOSIS — I82401 Acute embolism and thrombosis of unspecified deep veins of right lower extremity: Secondary | ICD-10-CM | POA: Diagnosis present

## 2024-03-31 DIAGNOSIS — I2699 Other pulmonary embolism without acute cor pulmonale: Secondary | ICD-10-CM | POA: Insufficient documentation

## 2024-03-31 DIAGNOSIS — Z7901 Long term (current) use of anticoagulants: Secondary | ICD-10-CM | POA: Diagnosis not present

## 2024-03-31 NOTE — Progress Notes (Signed)
 [facility]  Telephone:(336) [phone] Fax:(336) [phone]  ID: Jesus Dean OB: 20-Nov-1960  MR#: [id number]  RDW#:[id number]  Patient Care Team: Kristie Norleen BIRCH, MD (Inactive) as PCP - General (Neurology) Jacobo Jesus PARAS, MD as Consulting Physician (Oncology)  CHIEF COMPLAINT: Bilateral pulmonary embolism and right lower leg DVT.  INTERVAL HISTORY: Patient returns to clinic today for further evaluation and discussion of his laboratory results.  He continues to feel well and remains asymptomatic.  He he is tolerating Eliquis  without significant side effects.  He has no neurologic complaints.  He denies any recent fevers or illnesses.  He has a good appetite and denies weight loss.  He has no chest pain, shortness of breath, cough, or hemoptysis.  He denies any nausea, vomiting, constipation, or diarrhea.  He has no melena or hematochezia.  He has no urinary complaints.  Patient offers no specific complaints today.  REVIEW OF SYSTEMS:   Review of Systems  Constitutional: Negative.  Negative for fever, malaise/fatigue and weight loss.  Respiratory: Negative.  Negative for cough, hemoptysis and shortness of breath.   Cardiovascular: Negative.  Negative for chest pain and leg swelling.  Gastrointestinal: Negative.  Negative for abdominal pain.  Genitourinary: Negative.  Negative for dysuria.  Musculoskeletal: Negative.  Negative for back pain.  Skin: Negative.  Negative for rash.  Neurological: Negative.  Negative for dizziness, focal weakness, weakness and headaches.  Psychiatric/Behavioral: Negative.  The patient is not nervous/anxious.     As per HPI. Otherwise, a complete review of systems is negative.  PAST MEDICAL HISTORY: Past Medical History:  Diagnosis Date   Asthma    GERD (gastroesophageal reflux disease)    OCC   History of angina    History of migraine headaches    Hypothyroidism    Hypothyroidism, unspecified 12/10/2016   Migraine headache 12/10/2016    Neuropathy 09/11/2015   Last Assessment & Plan:  Denies any new complaints.     Peyronie disease    Rectal bleeding    Routine history and physical examination of adult 04/04/2015   Overview:  Prevnar 2016.  Last Assessment & Plan:  He is agreeable to colonoscopy.   Ordered colonoscopy. Check labs today.   Sleep apnea    DOES NOT HAVE CPAP-HAD THE 1ST PART OF THE TEST DONE AND DID NOT GO BACK FOR THE 2ND PART    PAST SURGICAL HISTORY: Past Surgical History:  Procedure Laterality Date   hernia Left    HERNIA REPAIR Right    INGUINAL HERNIA REPAIR Left 06/23/2017   Procedure: LAPAROSCOPIC INGUINAL HERNIA;  Surgeon: Desiderio Schanz, MD;  Location: [facility] ORS;  Service: General;  Laterality: Left;   INNER EAR SURGERY     MULTIPLE SURGERIES   MYRINGOTOMY WITH TUBE PLACEMENT Bilateral 08/23/2020   Procedure: MYRINGOTOMY WITH TUBE PLACEMENT;  Surgeon: Juengel, Paul, MD;  Location: [facility];  Service: ENT;  Laterality: Bilateral;  COVID positive 07/25/20   THYROIDECTOMY     TONSILLECTOMY AND ADENOIDECTOMY     VASECTOMY      FAMILY HISTORY: Family History  Problem Relation Age of Onset   COPD Father    Cancer Father        Colon   Lung cancer Father    Prostate cancer Father    Hypothyroidism Father    COPD Mother    Hypertension Mother    Hypothyroidism Mother    Brain cancer Sister     ADVANCED DIRECTIVES (Y/N):  N  HEALTH MAINTENANCE: Social  History   Tobacco Use   Smoking status: Never   Smokeless tobacco: Never  Vaping Use   Vaping status: Never Used  Substance Use Topics   Alcohol use: No   Drug use: No     Colonoscopy:  PAP:  Bone density:  Lipid panel:  No Known Allergies  Current Outpatient Medications  Medication Sig Dispense Refill   albuterol  (PROVENTIL  HFA;VENTOLIN  HFA) 108 (90 Base) MCG/ACT inhaler Inhale 2 puffs into the lungs every 6 (six) hours as needed for wheezing or shortness of breath.      apixaban  (ELIQUIS ) 5 MG TABS tablet Take 2  tablets (10 mg total) by mouth 2 (two) times daily for 5 days, THEN 1 tablet (5 mg total) 2 (two) times daily. 60 tablet 3   atorvastatin  (LIPITOR) 20 MG tablet Take 10 mg by mouth daily.     azithromycin (ZITHROMAX) 250 MG tablet Take 250 mg by mouth daily.     diphenhydrAMINE  (BENADRYL  ALLERGY) 25 mg capsule Take 1 capsule (25 mg total) by mouth every 6 (six) hours as needed for up to 5 days. 30 capsule 0   levothyroxine  (SYNTHROID , LEVOTHROID) 200 MCG tablet Take 200 mcg by mouth at bedtime.      omeprazole (PRILOSEC) 20 MG capsule Take 20 mg by mouth daily.     naproxen sodium (ALEVE) 220 MG tablet Take 220 mg by mouth as needed. (Patient not taking: Reported on 03/31/2024)     No current facility-administered medications for this visit.    OBJECTIVE: Vitals:   03/31/24 1050  BP: 123/75  Pulse: 68  Resp: 18  Temp: 98.8 F (37.1 C)  SpO2: 98%     Body mass index is 32.14 kg/m.    ECOG FS:0 - Asymptomatic  General: Well-developed, well-nourished, no acute distress. Eyes: Pink conjunctiva, anicteric sclera. HEENT: Normocephalic, moist mucous membranes. Lungs: No audible wheezing or coughing. Heart: Regular rate and rhythm. Abdomen: Soft, nontender, no obvious distention. Musculoskeletal: No edema, cyanosis, or clubbing. Neuro: Alert, answering all questions appropriately. Cranial nerves grossly intact. Skin: No rashes or petechiae noted. Psych: Normal affect.  LAB RESULTS:  Lab Results  Component Value Date   NA 139 01/28/2024   K 3.9 01/28/2024   CL 101 01/28/2024   CO2 26 01/28/2024   GLUCOSE 153 (H) 01/28/2024   BUN 16 01/28/2024   CREATININE 0.60 (L) 01/28/2024   CALCIUM  8.7 (L) 01/28/2024   PROT 5.0 (L) 02/06/2021   ALBUMIN 3.2 (L) 02/06/2021   AST 15 02/06/2021   ALT 22 02/06/2021   ALKPHOS 39 02/06/2021   BILITOT 0.9 02/06/2021   GFRNONAA >60 01/28/2024   GFRAA >60 01/09/2012    Lab Results  Component Value Date   WBC 8.9 01/28/2024   NEUTROABS 7.9  (H) 01/25/2024   HGB 14.0 01/28/2024   HCT 40.5 01/28/2024   MCV 86.9 01/28/2024   PLT 238 01/28/2024     STUDIES: No results found.  ASSESSMENT: Bilateral pulmonary embolism and right lower leg DVT.  PLAN:    Bilateral pulmonary embolism and right lower leg DVT: Diagnosed on January 25, 2024 with CT scan of the chest and lower extremity Doppler.  Patient reports he was on a car ride to [facility], [place]  the week prior to his symptoms but no other obvious transient risk factors.  Full hypercoagulable workup is negative.  Recommended patient continue Eliquis  for minimum of 6 months through January 2026 and then discontinue treatment.  We talked at length regarding risks for developing  additional blood clots and mitigating factors.  No further intervention is needed.  If patient had another blood clot for any reason, would likely recommend lifelong anticoagulation at that time.  No further follow-up has been scheduled.   I spent a total of 20 minutes reviewing chart data, face-to-face evaluation with the patient, counseling and coordination of care as detailed above.   Patient expressed understanding and was in agreement with this plan. He also understands that He can call clinic at any time with any questions, concerns, or complaints.    Jesus JINNY Reusing, MD   03/31/2024 11:01 AM

## 2024-04-10 ENCOUNTER — Emergency Department: Admission: EM | Admit: 2024-04-10 | Discharge: 2024-04-10 | Disposition: A | Discharge status: Home / Self Care

## 2024-04-10 ENCOUNTER — Other Ambulatory Visit: Payer: Self-pay

## 2024-04-10 ENCOUNTER — Emergency Department

## 2024-04-10 DIAGNOSIS — E039 Hypothyroidism, unspecified: Secondary | ICD-10-CM | POA: Insufficient documentation

## 2024-04-10 DIAGNOSIS — I10 Essential (primary) hypertension: Secondary | ICD-10-CM | POA: Insufficient documentation

## 2024-04-10 DIAGNOSIS — R079 Chest pain, unspecified: Secondary | ICD-10-CM

## 2024-04-10 DIAGNOSIS — R0789 Other chest pain: Secondary | ICD-10-CM | POA: Insufficient documentation

## 2024-04-10 DIAGNOSIS — I2699 Other pulmonary embolism without acute cor pulmonale: Secondary | ICD-10-CM | POA: Diagnosis not present

## 2024-04-10 DIAGNOSIS — Z8673 Personal history of transient ischemic attack (TIA), and cerebral infarction without residual deficits: Secondary | ICD-10-CM | POA: Insufficient documentation

## 2024-04-10 DIAGNOSIS — R519 Headache, unspecified: Secondary | ICD-10-CM | POA: Diagnosis not present

## 2024-04-10 DIAGNOSIS — J45909 Unspecified asthma, uncomplicated: Secondary | ICD-10-CM | POA: Insufficient documentation

## 2024-04-10 DIAGNOSIS — Z7901 Long term (current) use of anticoagulants: Secondary | ICD-10-CM | POA: Diagnosis not present

## 2024-04-10 DIAGNOSIS — D72829 Elevated white blood cell count, unspecified: Secondary | ICD-10-CM | POA: Diagnosis not present

## 2024-04-10 LAB — CBC WITH DIFFERENTIAL/PLATELET
Abs Immature Granulocytes: 0.05 K/uL (ref 0.00–0.07)
Basophils Absolute: 0 K/uL (ref 0.0–0.1)
Basophils Relative: 0 %
Eosinophils Absolute: 0 K/uL (ref 0.0–0.5)
Eosinophils Relative: 0 %
HCT: 38.4 % — ABNORMAL LOW (ref 39.0–52.0)
Hemoglobin: 13.8 g/dL (ref 13.0–17.0)
Immature Granulocytes: 0 %
Lymphocytes Relative: 5 %
Lymphs Abs: 0.8 K/uL (ref 0.7–4.0)
MCH: 30 pg (ref 26.0–34.0)
MCHC: 35.9 g/dL (ref 30.0–36.0)
MCV: 83.5 fL (ref 80.0–100.0)
Monocytes Absolute: 0.9 K/uL (ref 0.1–1.0)
Monocytes Relative: 5 %
Neutro Abs: 14.5 K/uL — ABNORMAL HIGH (ref 1.7–7.7)
Neutrophils Relative %: 90 %
Platelets: 219 K/uL (ref 150–400)
RBC: 4.6 MIL/uL (ref 4.22–5.81)
RDW: 12 % (ref 11.5–15.5)
WBC: 16.3 K/uL — ABNORMAL HIGH (ref 4.0–10.5)
nRBC: 0 % (ref 0.0–0.2)

## 2024-04-10 LAB — COMPREHENSIVE METABOLIC PANEL WITH GFR
ALT: 19 U/L (ref 0–44)
AST: 21 U/L (ref 15–41)
Albumin: 3.3 g/dL — ABNORMAL LOW (ref 3.5–5.0)
Alkaline Phosphatase: 53 U/L (ref 38–126)
Anion gap: 11 (ref 5–15)
BUN: 11 mg/dL (ref 8–23)
CO2: 23 mmol/L (ref 22–32)
Calcium: 8.4 mg/dL — ABNORMAL LOW (ref 8.9–10.3)
Chloride: 101 mmol/L (ref 98–111)
Creatinine, Ser: 0.64 mg/dL (ref 0.61–1.24)
GFR, Estimated: >60 mL/min (ref >60)
Glucose, Bld: 244 mg/dL — ABNORMAL HIGH (ref 70–99)
Potassium: 3.6 mmol/L (ref 3.5–5.1)
Sodium: 135 mmol/L (ref 135–145)
Total Bilirubin: 0.9 mg/dL (ref 0.0–1.2)
Total Protein: 6.2 g/dL — ABNORMAL LOW (ref 6.5–8.1)

## 2024-04-10 LAB — BRAIN NATRIURETIC PEPTIDE: B Natriuretic Peptide: 7.4 pg/mL (ref 0.0–100.0)

## 2024-04-10 LAB — PROTIME-INR
INR: 1.1 (ref 0.8–1.2)
Prothrombin Time: 14.8 s (ref 11.4–15.2)

## 2024-04-10 LAB — LIPASE, BLOOD: Lipase: 21 U/L (ref 11–51)

## 2024-04-10 LAB — TROPONIN I (HIGH SENSITIVITY)
Troponin I (High Sensitivity): 5 ng/L (ref <18)
Troponin I (High Sensitivity): 7 ng/L (ref <18)

## 2024-04-10 MED ORDER — METOCLOPRAMIDE HCL 5 MG/ML IJ SOLN
10.0000 mg | Freq: Once | INTRAMUSCULAR | Status: AC
  Administered 2024-04-10: 10 mg via INTRAVENOUS
  Filled 2024-04-10: qty 2

## 2024-04-10 MED ORDER — ALUMINUM-MAGNESIUM-SIMETHICONE 200-200-20 MG/5ML PO SUSP: 30.0000 mL | Freq: Three times a day (TID) | ORAL | 0 refills | Status: AC

## 2024-04-10 MED ORDER — DIPHENHYDRAMINE HCL 50 MG/ML IJ SOLN
25.0000 mg | Freq: Once | INTRAMUSCULAR | Status: AC
  Administered 2024-04-10: 25 mg via INTRAVENOUS
  Filled 2024-04-10: qty 1

## 2024-04-10 MED ORDER — ACETAMINOPHEN 500 MG PO TABS
1000.0000 mg | ORAL_TABLET | Freq: Once | ORAL | Status: AC
  Administered 2024-04-10: 1000 mg via ORAL
  Filled 2024-04-10: qty 2

## 2024-04-10 MED ORDER — IOHEXOL 350 MG/ML SOLN
125.0000 mL | Freq: Once | INTRAVENOUS | Status: AC | PRN
  Administered 2024-04-10: 125 mL via INTRAVENOUS

## 2024-04-10 MED ORDER — ACETAMINOPHEN 500 MG PO TABS
1000.0000 mg | ORAL_TABLET | Freq: Four times a day (QID) | ORAL | 2 refills | Status: AC | PRN
Start: 2024-04-10 — End: 2025-04-10

## 2024-04-10 MED ORDER — SODIUM CHLORIDE 0.9 % IV BOLUS
500.0000 mL | Freq: Once | INTRAVENOUS | Status: AC
  Administered 2024-04-10: 500 mL via INTRAVENOUS

## 2024-04-10 MED ORDER — KETOROLAC TROMETHAMINE 15 MG/ML IJ SOLN
15.0000 mg | Freq: Once | INTRAMUSCULAR | Status: AC
  Administered 2024-04-10: 15 mg via INTRAVENOUS
  Filled 2024-04-10: qty 1

## 2024-04-10 NOTE — Discharge Instructions (Signed)
 Your evaluation in the emergency department was overall reassuring.  I am unsure of the exact cause of your chest discomfort, but could possibly have been due to irritation of the lining of your stomach.  Continue to take your antacid medication, have also prescribed Maalox.  You can use Tylenol  as needed for any recurrent headache.  Please do follow-up closely with your primary care doctor for reevaluation, and have also placed a referral for you to see a cardiologist--they will contact you to schedule appointment.  Return to the emergency department with any new or worsening symptoms.
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
## 2024-04-10 NOTE — ED Provider Notes (Signed)
 [facility] Provider Note    Event Date/Time   First MD Initiated Contact with Patient 04/10/24 1624     (approximate)   History   Chest Pain  BIBEMS from side of road. Pt was driving and felt lightheaded so pulled over. C/o of L sided chest pain that radiates to L shoulder since 10am. Pt's pain was a 6, went down to a 5 after 324 of aspirin  and 0.4 of nitroglycerin given by EMS. BP 100/50 after nitroglycerin, 500mL fluid bolus w/ EMS. Pt states he also has has trouble making up words and speaking, which started yesterday at 10am and has continued into today. Hx of stroke in 2017. Pt was 90% of RA w/ EMS, 95% w/ 2L. 118/60, otherwise VSS. CBG 318.     HPI Jesus Dean is a 63 y.o. male PMH asthma, hypertension, hypothyroidism, migraine headaches, prior PE on Eliquis  presents for evaluation of lightheadedness, chest pain -Per chart review, last admitted 01/25/2024-01/28/2024 due to bilateral PE with mild right heart strain.  Treated with heparin .  - On my evaluation, patient states he was driving earlier, felt lightheaded as if he was going to pass out.  Pulled over the side of the road.  Subsequently developed severe left-sided chest pain.  Received nitroglycerin by EMS, reportedly was borderline hypoxic as well.  Brought to ED for eval. -Notes chest pain was previously 7/10, now improved but persistent, 3-4/10.  No preceding trauma.  Is taking his anticoagulation.  Notes that he was having paresthesias in his left arm as well. -Several he notes that since yesterday evening he felt as if he may be having difficulty with his speech.  Has a history of migraines and says he has a headache similar to other prior migraines.     Physical Exam   Triage Vital Signs: ED Triage Vitals  Encounter Vitals Group     BP 04/10/24 1613 121/73     Girls Systolic BP Percentile --      Girls Diastolic BP Percentile --      Boys Systolic BP Percentile --      Boys Diastolic BP  Percentile --      Pulse Rate 04/10/24 1613 92     Resp 04/10/24 1613 20     Temp 04/10/24 1613 99 F (37.2 C)     Temp Source 04/10/24 1613 Oral     SpO2 04/10/24 1613 99 %     Weight 04/10/24 1614 242 lb (109.8 kg)     Height 04/10/24 1614 6' (1.829 m)     Head Circumference --      Peak Flow --      Pain Score 04/10/24 1614 4     Pain Loc --      Pain Education --      Exclude from Growth Chart --     Most recent vital signs: Vitals:   04/10/24 1613 04/10/24 2104  BP: 121/73 111/67  Pulse: 92 73  Resp: 20 17  Temp: 99 F (37.2 C) 98.8 F (37.1 C)  SpO2: 99% 96%     General: Awake, no distress.  CV:  Good peripheral perfusion. RRR, equal pulses all extremities  Resp:  Normal effort. CTAB Abd:  No distention. Nontender to deep palpation throughout, no pulsatile masses Neuro:  Aox4, CN II-XII intact, FNF wnl, finger taps fast b/l, 5/5 strength in bilateral finger extension/grip, arm flexion/extension, EHL/FHL. BUE AG 10+ sec no drift, BLE AG 5+ sec no drift.  Ambulates with steady gait. SILT. Negative Rhomberg.    ED Results / Procedures / Treatments   Labs (all labs ordered are listed, but only abnormal results are displayed) Labs Reviewed  COMPREHENSIVE METABOLIC PANEL WITH GFR - Abnormal; Notable for the following components:      Result Value   Glucose, Bld 244 (*)    Calcium  8.4 (*)    Total Protein 6.2 (*)    Albumin 3.3 (*)    All other components within normal limits  CBC WITH DIFFERENTIAL/PLATELET - Abnormal; Notable for the following components:   WBC 16.3 (*)    HCT 38.4 (*)    Neutro Abs 14.5 (*)    All other components within normal limits  LIPASE, BLOOD  PROTIME-INR  BRAIN NATRIURETIC PEPTIDE  TROPONIN I (HIGH SENSITIVITY)  TROPONIN I (HIGH SENSITIVITY)     EKG  See ED course below   RADIOLOGY Radiology interpreted by myself and radiology report reviewed.  No acute pathology identified.    PROCEDURES:  Critical Care performed:  No  Procedures   MEDICATIONS ORDERED IN ED: Medications  metoCLOPramide (REGLAN) injection 10 mg (10 mg Intravenous Given 04/10/24 1720)  diphenhydrAMINE  (BENADRYL ) injection 25 mg (25 mg Intravenous Given 04/10/24 1721)  sodium chloride  0.9 % bolus 500 mL (0 mLs Intravenous Stopped 04/10/24 2121)  acetaminophen  (TYLENOL ) tablet 1,000 mg (1,000 mg Oral Given 04/10/24 1719)  iohexol  (OMNIPAQUE ) 350 MG/ML injection 125 mL (125 mLs Intravenous Contrast Given 04/10/24 1756)  ketorolac  (TORADOL ) 15 MG/ML injection 15 mg (15 mg Intravenous Given 04/10/24 2123)     IMPRESSION / MDM / ASSESSMENT AND PLAN / ED COURSE  I reviewed the triage vital signs and the nursing notes.                              DDX/MDM/AP: Differential diagnosis includes, but is not limited to, worsened PE burden, aortic dissection, ACS, arrhythmia, vasovagal episode.  Also consider possibility of intracranial hemorrhage/stroke.  History of migraines, suspect same.  No meningismus or other findings to suggest meningitis.  Doubt intra-abdominal pathology.  Plan: - EKG - labs - Cardiac monitor - Chest x-ray - CT PE, CTA head and neck - Migraine cocktail - Reassess  Patient's presentation is most consistent with acute presentation with potential threat to life or bodily function.  The patient is on the cardiac monitor to evaluate for evidence of arrhythmia and/or significant heart rate changes.  ED course below.  Workup unremarkable including serial troponins, CT PE, CTA head and neck.  Nonfocal neurologic exam here, no clinical concern for stroke.  Some leukocytosis but no fever, no clear underlying source of infection, not clinically concern for meningitis at this time.  Shared decision making, patient is amenable to outpatient follow-up with cardiology, referral placed.  Overall doubt cardiac etiology at this time however, continue his home antacid and also prescribed Maalox.  Can use Tylenol  for any recurrent headache.   Strict ED return precautions in place.  Patient agrees with plan.  Clinical Course as of 04/10/24 2124  Sun Apr 10, 2024  1639 Ecg = sinus rhythm, rate 76, no gross ST elevation or depression, T wave inversions in leads III and aVF, normal axis, normal intervals.  No clear evidence of ischemia no arrhythmia on my interpretation. [MM]  1720 Chest x-ray unremarkable [MM]  1757 Trop wnl Bnp wnl Biliary labs normal  CBC with leukocytosis and left shift, otherwise unremarkable [MM]  1834  CTPE: IMPRESSION: 1. Suboptimal contrast bolus timing for pulmonary artery assessment, additionally there is moderate patient motion artifact in the mid-lower lung zones. Allowing for these limitations, the previous filling defects within the central pulmonary arteries have resolved. There are no residual filling defects to the segmental level. Cannot assess subsegmental branches. 2. No acute aortic findings. 3. Dilated main pulmonary artery suggesting pulmonary arterial hypertension. 4. Mild cardiomegaly. Coronary artery calcifications. 5. No evidence of pneumonia or pulmonary infarct.   [MM]  1929 CTA head/neck: IMPRESSION: 1. No acute intracranial hemorrhage or acute ischemic change. 2. Mild cerebral volume loss. 3. No emergent large vessel occlusion.   [MM]  1953 Rpt trop stable [MM]  2119 Patient reevaluated, some persistent headache, fortunately nonfocal neurologic exam here and no ataxia  Serial troponins negative.  EKG nonischemic.  No evidence of acute pathology at this time.  In shared decision making, patient is comfortable with discharge home and outpatient follow-up with cardiologist as opposed to admission for stress testing, doubt cardiac etiology at this time.  He is already on antiacid, will also Rx Maalox.  Strict ED return precautions in place.  Patient agrees with plan. [MM]    Clinical Course User Index [MM] Clarine Ozell LABOR, MD     FINAL CLINICAL IMPRESSION(S) / ED  DIAGNOSES   Final diagnoses:  Nonspecific chest pain  Nonintractable headache, unspecified chronicity pattern, unspecified headache type     Rx / DC Orders   ED Discharge Orders          Ordered    Ambulatory referral to Cardiology        04/10/24 2121    aluminum-magnesium  hydroxide-simethicone (MAALOX) 200-200-20 MG/5ML SUSP  3 times daily before meals & bedtime        04/10/24 2121    acetaminophen  (TYLENOL ) 500 MG tablet  Every 6 hours PRN        04/10/24 2121             Note:  This document was prepared using Dragon voice recognition software and may include unintentional dictation errors.   Clarine Ozell LABOR, MD 04/10/24 2124
